# Patient Record
Sex: Female | Born: 1985
Health system: Southern US, Community
[De-identification: ages and names within clinical notes are randomized; demographics above are authoritative.]

## PROBLEM LIST (undated history)

## (undated) ENCOUNTER — Inpatient Hospital Stay (HOSPITAL_COMMUNITY): Payer: Self-pay

## (undated) DIAGNOSIS — Z789 Other specified health status: Secondary | ICD-10-CM

## (undated) HISTORY — PX: NO PAST SURGERIES: SHX2092

---
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
## 2010-09-05 ENCOUNTER — Emergency Department (HOSPITAL_COMMUNITY)
Admission: EM | Admit: 2010-09-05 | Discharge: 2010-09-05 | Disposition: A | Discharge status: Home / Self Care | Payer: No Typology Code available for payment source | Attending: Emergency Medicine | Admitting: Emergency Medicine

## 2010-09-05 ENCOUNTER — Emergency Department (HOSPITAL_COMMUNITY): Payer: Self-pay

## 2010-09-05 DIAGNOSIS — IMO0002 Reserved for concepts with insufficient information to code with codable children: Secondary | ICD-10-CM | POA: Insufficient documentation

## 2010-09-05 DIAGNOSIS — Y92410 Unspecified street and highway as the place of occurrence of the external cause: Secondary | ICD-10-CM | POA: Insufficient documentation

## 2010-09-05 DIAGNOSIS — S8010XA Contusion of unspecified lower leg, initial encounter: Secondary | ICD-10-CM | POA: Insufficient documentation

## 2010-09-05 DIAGNOSIS — M79609 Pain in unspecified limb: Secondary | ICD-10-CM | POA: Insufficient documentation

## 2015-05-07 ENCOUNTER — Emergency Department (HOSPITAL_COMMUNITY)
Admission: EM | Admit: 2015-05-07 | Discharge: 2015-05-07 | Disposition: A | Discharge status: Home / Self Care | Payer: 59 | Attending: Emergency Medicine | Admitting: Emergency Medicine

## 2015-05-07 ENCOUNTER — Encounter (HOSPITAL_COMMUNITY): Payer: Self-pay

## 2015-05-07 DIAGNOSIS — F17200 Nicotine dependence, unspecified, uncomplicated: Secondary | ICD-10-CM | POA: Insufficient documentation

## 2015-05-07 DIAGNOSIS — H578 Other specified disorders of eye and adnexa: Secondary | ICD-10-CM | POA: Diagnosis present

## 2015-05-07 DIAGNOSIS — H109 Unspecified conjunctivitis: Secondary | ICD-10-CM

## 2015-05-07 MED ORDER — POLYMYXIN B-TRIMETHOPRIM 10000-0.1 UNIT/ML-% OP SOLN: 1.0000 [drp] | OPHTHALMIC | Status: DC

## 2015-05-07 NOTE — Discharge Instructions (Signed)
You were seen in the ED for pink eye.  This is likely viral but we will treat you for bacterial infection.    Bacterial Conjunctivitis Bacterial conjunctivitis (commonly called pink eye) is redness, soreness, or puffiness (inflammation) of the white part of your eye. It is caused by a germ called bacteria. These germs can easily spread from person to person (contagious). Your eye often will become red or pink. Your eye may also become irritated, watery, or have a thick discharge.  HOME CARE  1. Apply a cool, clean washcloth over closed eyelids. Do this for 10-20 minutes, 3-4 times a day while you have pain. 2. Gently wipe away any fluid coming from the eye with a warm, wet washcloth or cotton ball. 3. Wash your hands often with soap and water. Use paper towels to dry your hands. 4. Do not share towels or washcloths. 5. Change or wash your pillowcase every day. 6. Do not use eye makeup until the infection is gone. 7. Do not use machines or drive if your vision is blurry. 8. Stop using contact lenses. Do not use them again until your doctor says it is okay. 9. Do not touch the tip of the eye drop bottle or medicine tube with your fingers when you put medicine on the eye. GET HELP RIGHT AWAY IF:  1. Your eye is not better after 3 days of starting your medicine. 2. You have a yellowish fluid coming out of the eye. 3. You have more pain in the eye. 4. Your eye redness is spreading. 5. Your vision becomes blurry. 6. You have a fever or lasting symptoms for more than 2-3 days. 7. You have a fever and your symptoms suddenly get worse. 8. You have pain in the face. 9. Your face gets red or puffy (swollen). MAKE SURE YOU:   Understand these instructions.  Will watch this condition.  Will get help right away if you are not doing well or get worse.   This information is not intended to replace advice given to you by your health care provider. Make sure you discuss any questions you have with your  health care provider.   Document Released: 12/04/2007 Document Revised: 02/11/2012 Document Reviewed: 10/31/2011 Elsevier Interactive Patient Education 2016 [employer].  How to Use Eye Drops and Eye Ointments HOW TO APPLY EYE DROPS Follow these steps when applying eye drops: 10. Wash your hands. 11. Tilt your head back. 12. Put a finger under your eye and use it to gently pull your lower lid downward. Keep that finger in place. 13. Using your other hand, hold the dropper between your thumb and index finger. 14. Position the dropper just over the edge of the lower lid. Hold it as close to your eye as you can without touching the dropper to your eye. 15. Steady your hand. One way to do this is to lean your index finger against your brow. 16. Look up. 17. Slowly and gently squeeze one drop of medicine into your eye. 18. Close your eye. 19. Place a finger between your lower eyelid and your nose. Press gently for 2 minutes. This increases the amount of time that the medicine is exposed to the eye. It also reduces side effects that can develop if the drop gets into the bloodstream through the nose. HOW TO APPLY EYE OINTMENTS Follow these steps when applying eye ointments: 10. Wash your hands. 11. Put a finger under your eye and use it to gently pull your lower  lid downward. Keep that finger in place. 12. Using your other hand, place the tip of the tube between your thumb and index finger with the remaining fingers braced against your cheek or nose. 13. Hold the tube just over the edge of your lower lid without touching the tube to your lid or eyeball. 14. Look up. 15. Line the inner part of your lower lid with ointment. 16. Gently pull up on your upper lid and look down. This will force the ointment to spread over the surface of the eye. 17. Release the upper lid. 18. If you can, close your eyes for 1-2 minutes. Do not rub your eyes. If you applied the ointment correctly, your vision will  be blurry for a few minutes. This is normal. ADDITIONAL INFORMATION  Make sure to use the eye drops or ointment as told by your health care provider.  If you have been told to use both eye drops and an eye ointment, apply the eye drops first, then wait 3-4 minutes before you apply the ointment.  Try not to touch the tip of the dropper or tube to your eye. A dropper or tube that has touched the eye can become contaminated.   This information is not intended to replace advice given to you by your health care provider. Make sure you discuss any questions you have with your health care provider.   Document Released: 06/02/2000 Document Revised: 07/11/2014 Document Reviewed: 02/20/2014 Elsevier Interactive Patient Education [employer]

## 2015-05-07 NOTE — ED Notes (Signed)
PT reports felt like she had something in her r eye Friday.  Woke up Sat and r eye was matted together.  Eye red, denies pain.

## 2015-05-07 NOTE — ED Provider Notes (Signed)
CSN: [id number]     Arrival date & time 05/07/15  7829 History   First MD Initiated Contact with Patient 05/07/15 [phone]     Chief Complaint  Patient presents with  . Eye Problem   The history is provided by the patient.   Patient notes that she developed a pink eye on R side 2 days ago.  It started off feeling like she had something in her eye but she could not find anything that was causing the foreign body sensation.  She notes that yesterday morning, she woke up and her right eye lids were stuck together.  She denies fevers, chills, pain with EOM, facial pain, visual disturbance, sick contacts.  No allergies.  Does not wear contacts.  History reviewed. No pertinent past medical history. History reviewed. No pertinent past surgical history. No family history on file. Social History  Substance Use Topics  . Smoking status: Current Every Day Smoker  . Smokeless tobacco: None  . Alcohol Use: No   OB History    No data available     Review of Systems  Constitutional: Negative for fever.  HENT: Negative for congestion, rhinorrhea and sinus pressure.   Eyes: Positive for discharge, redness and itching. Negative for photophobia, pain and visual disturbance.  Respiratory: Negative for cough.   Neurological: Negative for headaches.    Allergies  Review of patient's allergies indicates no known allergies.  Home Medications   Prior to Admission medications   Medication Sig Start Date End Date Taking? Authorizing Provider  trimethoprim-polymyxin b (POLYTRIM) ophthalmic solution Place 1 drop into the right eye every 4 (four) hours. x5 days. 05/07/15   Ashly M Gottschalk, DO   BP 136/83 mmHg  Pulse 91  Temp(Src) 97.4 F (36.3 C) (Oral)  Resp 16  Ht  (1.651 m)  Wt 72.576 kg  BMI 26.63 kg/m2  SpO2 99%  LMP 04/11/2015 Physical Exam  Constitutional: She appears well-developed and well-nourished. No distress.  HENT:  Head: Normocephalic and atraumatic.  Mouth/Throat:  Oropharynx is clear and moist.  Eyes: EOM are normal. Pupils are equal, round, and reactive to light. Right eye exhibits no discharge. Left eye exhibits no discharge.   Conjunctival injection in R eye.  No evidence of foreign body.  No pain with EOM.  No purulence visualized.  Neck: Normal range of motion.  Cardiovascular: Normal rate.   Pulmonary/Chest: Effort normal.  Musculoskeletal: Normal range of motion.  Neurological: She is alert. She exhibits normal muscle tone. Coordination normal.  Skin: Skin is warm. She is not diaphoretic.  Psychiatric: She has a normal mood and affect. Her behavior is normal. Thought content normal.   ED Course  Procedures (including critical care time) Labs Review Labs Reviewed - No data to display  Imaging Review No results found. I have personally reviewed and evaluated these images and lab results as part of my medical decision-making.   EKG Interpretation None      MDM   Final diagnoses:  Conjunctivitis of right eye    Meloni D Riviere is a 30 y.o. female that presents to ED with a 2 day history of ride sided pink eye.  No evidence of foreign body, peri-orbital cellulitis.  Low suspicion for allergic conjunctivitis given unilaterallity.  Low suspicion for corneal abrasion given absence of pain.  Likely viral vs bacterial conjunctivitis.  Patient does not use contact lenses.  Will empirically treat with PolyTrim eye gtts.  Return precautions reviewed.  Follow up with PCP as needed.  Raliegh Ip, DO 05/07/15 1610  Blane Ohara, MD 05/07/15 [phone]

## 2015-05-26 ENCOUNTER — Encounter (HOSPITAL_COMMUNITY): Payer: Self-pay | Admitting: *Deleted

## 2015-05-26 ENCOUNTER — Emergency Department (HOSPITAL_COMMUNITY)
Admission: EM | Admit: 2015-05-26 | Discharge: 2015-05-26 | Disposition: A | Discharge status: Home / Self Care | Payer: 59 | Attending: Emergency Medicine | Admitting: Emergency Medicine

## 2015-05-26 DIAGNOSIS — F17200 Nicotine dependence, unspecified, uncomplicated: Secondary | ICD-10-CM | POA: Insufficient documentation

## 2015-05-26 DIAGNOSIS — K047 Periapical abscess without sinus: Secondary | ICD-10-CM | POA: Insufficient documentation

## 2015-05-26 DIAGNOSIS — K0889 Other specified disorders of teeth and supporting structures: Secondary | ICD-10-CM | POA: Diagnosis present

## 2015-05-26 MED ORDER — TRAMADOL HCL 50 MG PO TABS: 50.0000 mg | ORAL_TABLET | Freq: Four times a day (QID) | ORAL | Status: DC | PRN

## 2015-05-26 MED ORDER — PENICILLIN V POTASSIUM 500 MG PO TABS: 500.0000 mg | ORAL_TABLET | Freq: Four times a day (QID) | ORAL | Status: DC

## 2015-05-26 MED ORDER — NAPROXEN 250 MG PO TABS
500.0000 mg | ORAL_TABLET | Freq: Once | ORAL | Status: AC
  Administered 2015-05-26: 500 mg via ORAL
  Filled 2015-05-26: qty 2

## 2015-05-26 MED ORDER — NAPROXEN 500 MG PO TABS: 500.0000 mg | ORAL_TABLET | Freq: Two times a day (BID) | ORAL | Status: DC

## 2015-05-26 NOTE — ED Provider Notes (Signed)
CSN: [id number]     Arrival date & time 05/26/15  0434 History   First MD Initiated Contact with Patient 05/26/15 0617     Chief Complaint  Patient presents with  . Dental Pain     (Consider location/radiation/quality/duration/timing/severity/associated sxs/prior Treatment) Patient is a 30 y.o. female presenting with tooth pain. The history is provided by the patient.  Dental Pain Location:  Lower Lower teeth location:  31/RL 2nd molar and 30/RL 1st molar Quality:  Radiating, pulsating, throbbing and shooting Severity:  Moderate Onset quality:  Gradual Duration:  1 month Timing:  Intermittent Progression:  Worsening Chronicity:  Recurrent Context: filling fell out and poor dentition   Relieved by:  Nothing Worsened by:  Cold food/drink Ineffective treatments:  Acetaminophen Associated symptoms: facial pain   Associated symptoms: no difficulty swallowing, no drooling, no facial swelling, no fever, no headaches, no neck pain, no neck swelling and no trismus   Risk factors: lack of dental care and periodontal disease   Risk factors: no diabetes and no immunosuppression     History reviewed. No pertinent past medical history. History reviewed. No pertinent past surgical history. History reviewed. No pertinent family history. Social History  Substance Use Topics  . Smoking status: Current Every Day Smoker  . Smokeless tobacco: None  . Alcohol Use: No   OB History    No data available     Review of Systems  Constitutional: Negative for fever.  HENT: Negative for drooling and facial swelling.   Musculoskeletal: Negative for neck pain.  Allergic/Immunologic: Negative for immunocompromised state.  Neurological: Negative for headaches.      Allergies  Review of patient's allergies indicates no known allergies.  Home Medications   Prior to Admission medications   Medication Sig Start Date End Date Taking? Authorizing Provider  naproxen (NAPROSYN) 500 MG tablet Take 1  tablet (500 mg total) by mouth 2 (two) times daily. 05/26/15   Derwood Kaplan, MD  penicillin v potassium (VEETID) 500 MG tablet Take 1 tablet (500 mg total) by mouth 4 (four) times daily. 05/26/15   Derwood Kaplan, MD  traMADol (ULTRAM) 50 MG tablet Take 1 tablet (50 mg total) by mouth every 6 (six) hours as needed. 05/26/15   Derwood Kaplan, MD  trimethoprim-polymyxin b (POLYTRIM) ophthalmic solution Place 1 drop into the right eye every 4 (four) hours. x5 days. 05/07/15   Ashly M Gottschalk, DO   BP 122/85 mmHg  Pulse 67  Temp(Src) 97.7 F (36.5 C)  Resp 18  Ht  (1.651 m)  Wt 160 lb (72.576 kg)  BMI 26.63 kg/m2  SpO2 99%  LMP 04/11/2015 Physical Exam  Constitutional: She is oriented to person, place, and time. She appears well-developed.  HENT:  Head: Normocephalic and atraumatic.  Mouth/Throat: No oropharyngeal exudate.  Right lower quadrant-  All 3 molars have decay. 2nd molar has significant decay with ? Exposed pulp. No trismus, no drooling  Eyes: EOM are normal.  Neck: Normal range of motion. Neck supple.  Cardiovascular: Normal rate.   Pulmonary/Chest: Effort normal.  Abdominal: Bowel sounds are normal.  Lymphadenopathy:    She has no cervical adenopathy.  Neurological: She is alert and oriented to person, place, and time.  Skin: Skin is warm and dry.  Nursing note and vitals reviewed.   ED Course  Procedures (including critical care time) Labs Review Labs Reviewed - No data to display  Imaging Review No results found. I have personally reviewed and evaluated these images and lab results  as part of my medical decision-making.   EKG Interpretation None      MDM   Final diagnoses:  Periapical abscess    DDx includes: - Periapical tooth infection - Dental abscess - Gingivitis - Dental trauma - Pulpitis - Nerve root compression  Appears to have periapical tooth infection and possible nerve root compression. She already has plan to see a Education officer, [place]entist [place]. Pen V K and meds ordered for pain control. No hard signs of deep infection or infection becoming systemic.     Derwood KaplanAnkit Cherrell Maybee, MD 05/26/15 [phone]

## 2015-05-26 NOTE — Discharge Instructions (Signed)
We saw you in the ER for the toothache. °No evidence of deep infection, no evidence of pus that can be drained out. °YOU WILL NEED TO SEE A DENTIST to get appropriate care. °Please take the antibiotics and pain meds provided in the interval. ° ° °Dental Abscess °A dental abscess is a collection of pus in or around a tooth. °CAUSES °This condition is caused by a bacterial infection around the root of the tooth that involves the inner part of the tooth (pulp). It may result from: °· Severe tooth decay. °· Trauma to the tooth that allows bacteria to enter into the pulp, such as a broken or chipped tooth. °· Severe gum disease around a tooth. °SYMPTOMS °Symptoms of this condition include: °· Severe pain in and around the infected tooth. °· Swelling and redness around the infected tooth, in the mouth, or in the face. °· Tenderness. °· Pus drainage. °· Bad breath. °· Bitter taste in the mouth. °· Difficulty swallowing. °· Difficulty opening the mouth. °· Nausea. °· Vomiting. °· Chills. °· Swollen neck glands. °· Fever. °DIAGNOSIS °This condition is diagnosed with examination of the infected tooth. During the exam, your dentist may tap on the infected tooth. Your dentist will also ask about your medical and dental history and may order X-rays. °TREATMENT °This condition is treated by eliminating the infection. This may be done with: °· Antibiotic medicine. °· A root canal. This may be performed to save the tooth. °· Pulling (extracting) the tooth. This may also involve draining the abscess. This is done if the tooth cannot be saved. °HOME CARE INSTRUCTIONS °· Take medicines only as directed by your dentist. °· If you were prescribed antibiotic medicine, finish all of it even if you start to feel better. °· Rinse your mouth (gargle) often with salt water to relieve pain or swelling. °· Do not drive or operate heavy machinery while taking pain medicine. °· Do not apply heat to the outside of your mouth. °· Keep all follow-up  visits as directed by your dentist. This is important. °SEEK MEDICAL CARE IF: °· Your pain is worse and is not helped by medicine. °SEEK IMMEDIATE MEDICAL CARE IF: °· You have a fever or chills. °· Your symptoms suddenly get worse. °· You have a very bad headache. °· You have problems breathing or swallowing. °· You have trouble opening your mouth. °· You have swelling in your neck or around your eye. °  °This information is not intended to replace advice given to you by your health care provider. Make sure you discuss any questions you have with your health care provider. °  °Document Released: 02/24/2005 Document Revised: 07/11/2014 Document Reviewed: 02/21/2014 °Elsevier Interactive Patient Education ©2016 [employer] Inc[employer] ° °

## 2015-05-26 NOTE — ED Notes (Signed)
Pt c/o pain to a tooth on the lower right side of her mouth.

## 2017-02-05 ENCOUNTER — Encounter (HOSPITAL_COMMUNITY): Payer: Self-pay | Admitting: Emergency Medicine

## 2017-02-05 ENCOUNTER — Other Ambulatory Visit: Payer: Self-pay

## 2017-02-05 ENCOUNTER — Emergency Department (HOSPITAL_COMMUNITY): Payer: Self-pay

## 2017-02-05 ENCOUNTER — Emergency Department (HOSPITAL_COMMUNITY)
Admission: EM | Admit: 2017-02-05 | Discharge: 2017-02-05 | Disposition: A | Discharge status: Home / Self Care | Payer: Self-pay | Attending: Emergency Medicine | Admitting: Emergency Medicine

## 2017-02-05 DIAGNOSIS — R079 Chest pain, unspecified: Secondary | ICD-10-CM | POA: Insufficient documentation

## 2017-02-05 DIAGNOSIS — F17200 Nicotine dependence, unspecified, uncomplicated: Secondary | ICD-10-CM | POA: Insufficient documentation

## 2017-02-05 LAB — CBC
HEMATOCRIT: 40.6 % (ref 36.0–46.0)
Hemoglobin: 13.2 g/dL (ref 12.0–15.0)
MCH: 31.7 pg (ref 26.0–34.0)
MCHC: 32.5 g/dL (ref 30.0–36.0)
MCV: 97.4 fL (ref 78.0–100.0)
Platelets: 260 10*3/uL (ref 150–400)
RBC: 4.17 MIL/uL (ref 3.87–5.11)
RDW: 13.2 % (ref 11.5–15.5)
WBC: 5.6 10*3/uL (ref 4.0–10.5)

## 2017-02-05 LAB — BASIC METABOLIC PANEL
Anion gap: 8 (ref 5–15)
BUN: 11 mg/dL (ref 6–20)
CALCIUM: 9.2 mg/dL (ref 8.9–10.3)
CHLORIDE: 103 mmol/L (ref 101–111)
CO2: 23 mmol/L (ref 22–32)
CREATININE: 0.75 mg/dL (ref 0.44–1.00)
GFR calc Af Amer: >60 mL/min (ref >60)
GFR calc non Af Amer: >60 mL/min (ref >60)
Glucose, Bld: 103 mg/dL — ABNORMAL HIGH (ref 65–99)
Potassium: 3.9 mmol/L (ref 3.5–5.1)
Sodium: 134 mmol/L — ABNORMAL LOW (ref 135–145)

## 2017-02-05 LAB — I-STAT TROPONIN, ED: Troponin i, poc: 0.05 ng/mL (ref 0.00–0.08)

## 2017-02-05 MED ORDER — NAPROXEN 500 MG PO TABS: 500.0000 mg | ORAL_TABLET | Freq: Two times a day (BID) | ORAL | 0 refills | Status: DC

## 2017-02-05 NOTE — Discharge Instructions (Signed)
Take medications as needed for pain, follow-up with a primary care doctor if not improving by next week, return to the emergency room for fevers chills, shortness of breath, worsening symptoms

## 2017-02-05 NOTE — ED Provider Notes (Signed)
[facility] Provider Note   CSN: [id number] Arrival date & time: 02/05/17  0800     History   Chief Complaint Chief Complaint  Patient presents with  . Chest Pain    HPI Heidi Owens is a 31 y.o. female.  HPI Patient presents to the emergency room for evaluation of chest pain.  Patient noticed discomfort in the center of her chest yesterday.  She also feels an aching in the back of her neck and shoulders.  It almost feels like a muscle pull.  She tried taking some antacid medications yesterday but it did not help.  Her symptoms persisted this morning so she came into the emergency room for evaluation.  She denies any trouble with any orifice of breath.  No trouble with any leg swelling.  No fevers or coughing.  Patient does not have any history of heart disease or lung disease.  No PE or DVT.  No family history of heart or lung disease. No recent travel.  No estrogen use. History reviewed. No pertinent past medical history.  There are no active problems to display for this patient.   History reviewed. No pertinent surgical history.  OB History    No data available       Home Medications    Prior to Admission medications   Medication Sig Start Date End Date Taking? Authorizing Provider  acetaminophen (TYLENOL) 325 MG tablet Take 325 mg by mouth every 6 (six) hours as needed for headache.   Yes [provider]  ibuprofen (ADVIL,MOTRIN) 200 MG tablet Take 400 mg by mouth every 6 (six) hours as needed for headache.   Yes [provider]  naproxen (NAPROSYN) 500 MG tablet Take 1 tablet (500 mg total) by mouth 2 (two) times daily. 02/05/17   Linwood DibblesKnapp, Liadan Guizar, MD    Family History History reviewed. No pertinent family history.  Social History Social History   Tobacco Use  . Smoking status: Current Every Day Smoker    Packs/day: 0.50  . Smokeless tobacco: Never Used  Substance Use Topics  . Alcohol use: Yes    Comment: occ  . Drug  use: No     Allergies   Patient has no known allergies.   Review of Systems Review of Systems  All other systems reviewed and are negative.    Physical Exam Updated Vital Signs BP 116/76 (BP Location: Left Arm)   Pulse 68   Temp (!) 97.4 F (36.3 C) (Oral)   Resp 20   Ht 1.651 m (5\' 5" )   Wt 72.6 kg (160 lb)   LMP 12/30/2016   SpO2 100%   BMI 26.63 kg/m   Physical Exam  Constitutional: She appears well-developed and well-nourished. No distress.  HENT:  Head: Normocephalic and atraumatic.  Right Ear: External ear normal.  Left Ear: External ear normal.  Eyes: Conjunctivae are normal. Right eye exhibits no discharge. Left eye exhibits no discharge. No scleral icterus.  Neck: Neck supple. No tracheal deviation present.  Cardiovascular: Normal rate, regular rhythm and intact distal pulses.  Pulmonary/Chest: Effort normal and breath sounds normal. No stridor. No respiratory distress. She has no wheezes. She has no rales.  No chest wall tenderness  Abdominal: Soft. Bowel sounds are normal. She exhibits no distension. There is no tenderness. There is no rebound and no guarding.  Musculoskeletal: She exhibits no edema or tenderness.  Mild tenderness palpation in the bilateral trapezius muscles  Neurological: She is alert. She has normal strength.  No cranial nerve deficit (no facial droop, extraocular movements intact, no slurred speech) or sensory deficit. She exhibits normal muscle tone. She displays no seizure activity. Coordination normal.  Skin: Skin is warm and dry. No rash noted.  Psychiatric: She has a normal mood and affect.  Nursing note and vitals reviewed.    ED Treatments / Results  Labs (all labs ordered are listed, but only abnormal results are displayed) Labs Reviewed  BASIC METABOLIC PANEL - Abnormal; Notable for the following components:      Result Value   Sodium 134 (*)    Glucose, Bld 103 (*)    All other components within normal limits  CBC    I-STAT TROPONIN, ED    EKG  EKG Interpretation  Date/Time:  Thursday February 05 2017 08:12:38 EST Ventricular Rate:  72 PR Interval:    QRS Duration: 75 QT Interval:  424 QTC Calculation: 464 R Axis:   75 Text Interpretation:  Sinus rhythm No old tracing to compare Confirmed by Linwood DibblesKnapp, Abrielle Finck [phone]([id number]) on 02/05/2017 8:18:39 AM       Radiology Dg Chest 2 View  Result Date: 02/05/2017 CLINICAL DATA:  Chest heaviness EXAM: CHEST  2 VIEW COMPARISON:  None. FINDINGS: The heart size and mediastinal contours are within normal limits. Both lungs are clear. The visualized skeletal structures are unremarkable. IMPRESSION: No active cardiopulmonary disease. Electronically Signed   By: Charlett NoseKevin  Dover M.D.   On: 02/05/2017 08:58    Procedures Procedures (including critical care time)  Medications Ordered in ED Medications - No data to display   Initial Impression / Assessment and Plan / ED Course  I have reviewed the triage vital signs and the nursing notes.  Pertinent labs & imaging results that were available during my care of the patient were reviewed by me and considered in my medical decision making (see chart for details).   Low risk heart score.  Doubt ACS.  Low risk wells, perc negative.  Doubt PE.  Suspect musculoskeletal etiology.  Will DC home with prescription for Naprosyn.  Follow-up with primary care doctor.  Warning signs and precautions discussed    Final Clinical Impressions(s) / ED Diagnoses   Final diagnoses:  Chest pain with low risk for cardiac etiology    ED Discharge Orders        Ordered    naproxen (NAPROSYN) 500 MG tablet  2 times daily     02/05/17 0950       Linwood DibblesKnapp, Debbe Crumble, MD 02/05/17 [phone]

## 2017-02-05 NOTE — ED Notes (Signed)
Patient transported to X-ray 

## 2017-02-05 NOTE — ED Triage Notes (Signed)
Chest pain center upper chest since yesterday. Constant. Worse with pressure when laying down. Denies n/v/d or SOB. Took antiacid medications without relief.

## 2017-08-02 ENCOUNTER — Other Ambulatory Visit: Payer: Self-pay

## 2017-08-02 ENCOUNTER — Emergency Department (HOSPITAL_COMMUNITY)
Admission: EM | Admit: 2017-08-02 | Discharge: 2017-08-02 | Disposition: A | Discharge status: Home / Self Care | Payer: Self-pay | Attending: Emergency Medicine | Admitting: Emergency Medicine

## 2017-08-02 ENCOUNTER — Encounter (HOSPITAL_COMMUNITY): Payer: Self-pay | Admitting: Emergency Medicine

## 2017-08-02 DIAGNOSIS — Y33XXXA Other specified events, undetermined intent, initial encounter: Secondary | ICD-10-CM | POA: Insufficient documentation

## 2017-08-02 DIAGNOSIS — S0501XA Injury of conjunctiva and corneal abrasion without foreign body, right eye, initial encounter: Secondary | ICD-10-CM | POA: Insufficient documentation

## 2017-08-02 DIAGNOSIS — F17210 Nicotine dependence, cigarettes, uncomplicated: Secondary | ICD-10-CM | POA: Insufficient documentation

## 2017-08-02 DIAGNOSIS — Y9289 Other specified places as the place of occurrence of the external cause: Secondary | ICD-10-CM | POA: Insufficient documentation

## 2017-08-02 DIAGNOSIS — Y939 Activity, unspecified: Secondary | ICD-10-CM | POA: Insufficient documentation

## 2017-08-02 DIAGNOSIS — Y990 Civilian activity done for income or pay: Secondary | ICD-10-CM | POA: Insufficient documentation

## 2017-08-02 MED ORDER — FLUORESCEIN SODIUM 1 MG OP STRP: 1.0000 | ORAL_STRIP | Freq: Once | OPHTHALMIC | Status: DC

## 2017-08-02 MED ORDER — TOBRAMYCIN 0.3 % OP SOLN
1.0000 [drp] | Freq: Once | OPHTHALMIC | Status: AC
  Administered 2017-08-02: 1 [drp] via OPHTHALMIC
  Filled 2017-08-02: qty 5

## 2017-08-02 MED ORDER — KETOROLAC TROMETHAMINE 0.5 % OP SOLN
1.0000 [drp] | Freq: Once | OPHTHALMIC | Status: AC
  Administered 2017-08-02: 1 [drp] via OPHTHALMIC

## 2017-08-02 MED ORDER — TETRACAINE HCL 0.5 % OP SOLN
2.0000 [drp] | Freq: Once | OPHTHALMIC | Status: DC
  Filled 2017-08-02: qty 4

## 2017-08-02 NOTE — ED Triage Notes (Signed)
Patient c/o right eye irritation. Patient states while at work on Thursday she felt like something was her eye, since then right eye red, dry, and irritated. Denies any drainage or blurred vision.

## 2017-08-02 NOTE — ED Provider Notes (Signed)
[facility] EMERGENCY DEPARTMENT Provider Note   CSN: [id number] Arrival date & time: 08/02/17  1442     History   Chief Complaint Chief Complaint  Patient presents with  . Eye Pain    HPI Heidi Owens is a 32 y.o. female.  HPI   Heidi Owens is a 32 y.o. female who presents to the Emergency Department complaining of foreign body sensation to the right eye.  Noticed irritation to her eye, admits to frequent rubbing of her eye then noticed redness and dryness to her eye.  She has used Visine drops without relief.  She denies swelling, decreased vision, discharge, facial pain or swelling, headache and use of corrective lenses..   History reviewed. No pertinent past medical history.  There are no active problems to display for this patient.   No past surgical history on file.   OB History    Gravida  0   Para  0   Term  0   Preterm  0   AB  0   Living  0     SAB  0   TAB  0   Ectopic  0   Multiple  0   Live Births  0            Home Medications    Prior to Admission medications   Medication Sig Start Date End Date Taking? Authorizing Provider  acetaminophen (TYLENOL) 325 MG tablet Take 325 mg by mouth every 6 (six) hours as needed for headache.   Yes [provider]  ibuprofen (ADVIL,MOTRIN) 200 MG tablet Take 400 mg by mouth every 6 (six) hours as needed for headache.   Yes [provider]    Family History No family history on file.  Social History Social History   Tobacco Use  . Smoking status: Current Every Day Smoker    Packs/day: 0.50    Types: Cigarettes  . Smokeless tobacco: Never Used  Substance Use Topics  . Alcohol use: Yes    Comment: occ  . Drug use: No     Allergies   Patient has no known allergies.   Review of Systems Review of Systems  Constitutional: Negative for chills and fever.  HENT: Negative for congestion and ear pain.   Eyes: Positive for pain and redness. Negative for discharge,  itching and visual disturbance.  Respiratory: Negative for chest tightness.   Gastrointestinal: Negative for nausea.  Skin: Negative for rash.  Neurological: Negative for dizziness, speech difficulty and headaches.     Physical Exam Updated Vital Signs BP 132/79 (BP Location: Right Arm)   Pulse 72   Temp 97.8 F (36.6 C) (Oral)   Resp 18   Ht  (1.651 m)   Wt 70.3 kg (155 lb)   LMP 07/10/2017   SpO2 100%   BMI 25.79 kg/m   Physical Exam  Constitutional: She is oriented to person, place, and time. She appears well-developed and well-nourished. No distress.  HENT:  Head: Normocephalic and atraumatic.  Eyes: Pupils are equal, round, and reactive to light. EOM are normal. Lids are everted and swept, no foreign bodies found. Right eye exhibits no chemosis, no discharge and no exudate. No foreign body present in the right eye. Right conjunctiva is injected. Left conjunctiva is not injected.  Fundoscopic exam:      The right eye shows no exudate and no papilledema.  Slit lamp exam:      The right eye shows corneal abrasion and  fluorescein uptake. The right eye shows no corneal ulcer, no foreign body and no hyphema.       The left eye shows no fluorescein uptake.  Slit lamp exam reveals negative Seidel's sign, no hyphema or FB.  Small corneal abrasion at the 3 o'clock position  Neck: Normal range of motion. Neck supple. No thyromegaly present.  Cardiovascular: Normal rate, regular rhythm and intact distal pulses.  No murmur heard. Pulmonary/Chest: Effort normal and breath sounds normal. No respiratory distress.  Musculoskeletal: Normal range of motion.  Lymphadenopathy:    She has no cervical adenopathy.  Neurological: She is alert and oriented to person, place, and time. She exhibits normal muscle tone. Coordination normal.  Skin: Skin is warm and dry. No rash noted.  Psychiatric: She has a normal mood and affect.  Nursing note and vitals reviewed.    ED Treatments /  Results  Labs (all labs ordered are listed, but only abnormal results are displayed) Labs Reviewed - No data to display  EKG None  Radiology No results found.  Procedures Procedures (including critical care time)  Medications Ordered in ED Medications  fluorescein ophthalmic strip 1 strip (1 strip Right Eye Handoff 08/02/17 1620)  tetracaine (PONTOCAINE) 0.5 % ophthalmic solution 2 drop (2 drops Right Eye Handoff 08/02/17 1621)     Initial Impression / Assessment and Plan / ED Course  I have reviewed the triage vital signs and the nursing notes.  Pertinent labs & imaging results that were available during my care of the patient were reviewed by me and considered in my medical decision making (see chart for details).         Visual acuity  OS 20/20 OD 20/20 OU 20/20      pt well appearing.     small corneal abrasion present.  Referral to ophthalm if needed.  Dispensed ketorolac and tobramycin.  Return precautions discussed.   Final Clinical Impressions(s) / ED Diagnoses   Final diagnoses:  Abrasion of right cornea, initial encounter    ED Discharge Orders    None       Rosey Bath 08/04/17 1823    Bethann Berkshire, MD 08/06/17 [phone]

## 2017-08-02 NOTE — Discharge Instructions (Addendum)
Cool compresses on/off to your eye.  Apply one drop of the ketorolac to your right eye 4 times a day and one drop of the tobramycin every 4 hours.  Apply the drops for 5- 7 days.  Call the eye doctor listed to arrange a follow-up appt in 2-3 days if not improving

## 2017-08-02 NOTE — ED Notes (Signed)
Awaiting eval  

## 2017-08-02 NOTE — ED Notes (Signed)
Visual acuity  OS 20/20 OD 20/20  OU 20/20 

## 2017-08-02 NOTE — ED Notes (Signed)
Awaiting meds from Rock Nephew, RN [facility] will retrieve

## 2017-08-03 ENCOUNTER — Emergency Department (HOSPITAL_COMMUNITY)
Admission: EM | Admit: 2017-08-03 | Discharge: 2017-08-03 | Disposition: A | Discharge status: Home / Self Care | Payer: Self-pay | Attending: Emergency Medicine | Admitting: Emergency Medicine

## 2017-08-03 ENCOUNTER — Encounter (HOSPITAL_COMMUNITY): Payer: Self-pay | Admitting: Emergency Medicine

## 2017-08-03 DIAGNOSIS — F17210 Nicotine dependence, cigarettes, uncomplicated: Secondary | ICD-10-CM | POA: Insufficient documentation

## 2017-08-03 DIAGNOSIS — S0501XA Injury of conjunctiva and corneal abrasion without foreign body, right eye, initial encounter: Secondary | ICD-10-CM

## 2017-08-03 DIAGNOSIS — Y33XXXD Other specified events, undetermined intent, subsequent encounter: Secondary | ICD-10-CM | POA: Insufficient documentation

## 2017-08-03 DIAGNOSIS — S0501XD Injury of conjunctiva and corneal abrasion without foreign body, right eye, subsequent encounter: Secondary | ICD-10-CM | POA: Insufficient documentation

## 2017-08-03 MED ORDER — TETRACAINE HCL 0.5 % OP SOLN
1.0000 [drp] | Freq: Once | OPHTHALMIC | Status: AC
  Administered 2017-08-03: 1 [drp] via OPHTHALMIC
  Filled 2017-08-03: qty 4

## 2017-08-03 MED ORDER — FLUORESCEIN SODIUM 1 MG OP STRP
1.0000 | ORAL_STRIP | Freq: Once | OPHTHALMIC | Status: AC
  Administered 2017-08-03: 1 via OPHTHALMIC
  Filled 2017-08-03: qty 1

## 2017-08-03 NOTE — ED Provider Notes (Signed)
[facility] EMERGENCY DEPARTMENT Provider Note   CSN: [id number] Arrival date & time: 08/03/17  3244     History   Chief Complaint Chief Complaint  Patient presents with  . Eye Problem    HPI Heidi Owens is a 32 y.o. female with no significant past medical history presenting in need of a work note so she can return to work.  She was seen here yesterday and diagnosed with a corneal abrasion.  She tried to work today but with her eye being red, they were concerned she may have pink eye and would not let her work.  She does endorse waking this morning with crusting along her eyelid margins and endorses increased tear production.  She denies vision changes.   The history is provided by the patient.    History reviewed. No pertinent past medical history.  There are no active problems to display for this patient.   History reviewed. No pertinent surgical history.   OB History    Gravida  0   Para  0   Term  0   Preterm  0   AB  0   Living  0     SAB  0   TAB  0   Ectopic  0   Multiple  0   Live Births  0            Home Medications    Prior to Admission medications   Medication Sig Start Date End Date Taking? Authorizing Provider  acetaminophen (TYLENOL) 325 MG tablet Take 325 mg by mouth every 6 (six) hours as needed for headache.    [provider]  ibuprofen (ADVIL,MOTRIN) 200 MG tablet Take 400 mg by mouth every 6 (six) hours as needed for headache.    [provider]    Family History History reviewed. No pertinent family history.  Social History Social History   Tobacco Use  . Smoking status: Current Every Day Smoker    Packs/day: 0.50    Types: Cigarettes  . Smokeless tobacco: Never Used  Substance Use Topics  . Alcohol use: Yes    Comment: occ  . Drug use: No     Allergies   Patient has no known allergies.   Review of Systems Review of Systems  Constitutional: Negative for chills and fever.  Eyes:  Positive for pain, discharge and redness. Negative for visual disturbance.  Skin: Negative for rash and wound.  All other systems reviewed and are negative.    Physical Exam Updated Vital Signs BP 110/75 (BP Location: Left Arm)   Pulse 62   Temp 98 F (36.7 C) (Oral)   Resp 16   Ht  (1.651 m)   Wt 70.3 kg (155 lb)   LMP 07/10/2017   SpO2 100%   BMI 25.79 kg/m   Physical Exam  Constitutional: She is oriented to person, place, and time. She appears well-developed and well-nourished.  HENT:  Head: Normocephalic and atraumatic.  Right Ear: Tympanic membrane and ear canal normal.  Left Ear: Tympanic membrane and ear canal normal.  Nose: Mucosal edema and rhinorrhea present.  Mouth/Throat: Uvula is midline, oropharynx is clear and moist and mucous membranes are normal. No oropharyngeal exudate, posterior oropharyngeal edema, posterior oropharyngeal erythema or tonsillar abscesses.  Eyes: Pupils are equal, round, and reactive to light. EOM and lids are normal. Right eye exhibits no exudate. Right conjunctiva is injected.  Slit lamp exam:      The right eye shows  corneal abrasion and fluorescein uptake.  Cardiovascular: Normal rate and normal heart sounds.  Pulmonary/Chest: Effort normal. No respiratory distress. She has no wheezes. She has no rales.  Abdominal: Soft. There is no tenderness.  Musculoskeletal: Normal range of motion.  Neurological: She is alert and oriented to person, place, and time.  Skin: Skin is warm and dry. No rash noted.  Psychiatric: She has a normal mood and affect.     ED Treatments / Results  Labs (all labs ordered are listed, but only abnormal results are displayed) Labs Reviewed - No data to display  EKG None  Radiology No results found.  Procedures Procedures (including critical care time)  Medications Ordered in ED Medications  tetracaine (PONTOCAINE) 0.5 % ophthalmic solution 1 drop (1 drop Right Eye Given by Other 08/03/17 4098)    fluorescein ophthalmic strip 1 strip (1 strip Right Eye Given by Other 08/03/17 1191)     Initial Impression / Assessment and Plan / ED Course  I have reviewed the triage vital signs and the nursing notes.  Pertinent labs & imaging results that were available during my care of the patient were reviewed by me and considered in my medical decision making (see chart for details).     Pt essentially here for work note. No change in current plan including tobrex, ketorolac gtts  Final Clinical Impressions(s) / ED Diagnoses   Final diagnoses:  Abrasion of right cornea, initial encounter    ED Discharge Orders    None       Victoriano Lain 08/03/17 0825    Samuel Jester, DO 08/08/17 [phone]

## 2017-08-03 NOTE — Discharge Instructions (Addendum)
Continue using the tobrex and ketoralac drops you were given yesterday.  It is ok to return to work.

## 2017-08-03 NOTE — ED Triage Notes (Signed)
Pt reports being seen yesterday and dx with corneal abrasion.  This morning right eye was crusted shut with yellow drainage.  Is concerned it is pink eye and they will not allow her at work.

## 2017-08-06 ENCOUNTER — Encounter (HOSPITAL_COMMUNITY): Payer: Self-pay

## 2017-08-06 ENCOUNTER — Other Ambulatory Visit: Payer: Self-pay

## 2017-08-06 ENCOUNTER — Emergency Department (HOSPITAL_COMMUNITY)
Admission: EM | Admit: 2017-08-06 | Discharge: 2017-08-07 | Disposition: A | Discharge status: Home / Self Care | Payer: Self-pay | Attending: Emergency Medicine | Admitting: Emergency Medicine

## 2017-08-06 DIAGNOSIS — O26899 Other specified pregnancy related conditions, unspecified trimester: Secondary | ICD-10-CM

## 2017-08-06 DIAGNOSIS — Z3A00 Weeks of gestation of pregnancy not specified: Secondary | ICD-10-CM | POA: Insufficient documentation

## 2017-08-06 DIAGNOSIS — O9989 Other specified diseases and conditions complicating pregnancy, childbirth and the puerperium: Secondary | ICD-10-CM | POA: Insufficient documentation

## 2017-08-06 DIAGNOSIS — F17210 Nicotine dependence, cigarettes, uncomplicated: Secondary | ICD-10-CM | POA: Insufficient documentation

## 2017-08-06 DIAGNOSIS — O26891 Other specified pregnancy related conditions, first trimester: Secondary | ICD-10-CM

## 2017-08-06 DIAGNOSIS — O99331 Smoking (tobacco) complicating pregnancy, first trimester: Secondary | ICD-10-CM | POA: Insufficient documentation

## 2017-08-06 DIAGNOSIS — O209 Hemorrhage in early pregnancy, unspecified: Secondary | ICD-10-CM

## 2017-08-06 DIAGNOSIS — R102 Pelvic and perineal pain: Secondary | ICD-10-CM | POA: Insufficient documentation

## 2017-08-06 DIAGNOSIS — R109 Unspecified abdominal pain: Secondary | ICD-10-CM

## 2017-08-06 MED ORDER — HYDROCODONE-ACETAMINOPHEN 5-325 MG PO TABS
1.0000 | ORAL_TABLET | Freq: Once | ORAL | Status: AC
  Administered 2017-08-06: 1 via ORAL
  Filled 2017-08-06: qty 1

## 2017-08-06 NOTE — ED Triage Notes (Signed)
My lower stomach is hurting and my period has been on for 2 days and it is only spotting per pt.  My regular period was at the beginning of the month per pt.  Patient states that she has been nauseated and vomited once today.

## 2017-08-06 NOTE — ED Provider Notes (Addendum)
[facility] EMERGENCY DEPARTMENT Provider Note   CSN: [id number] Arrival date & time: 08/06/17  2250     History   Chief Complaint Chief Complaint  Patient presents with  . Abdominal Pain    HPI Heidi Owens is a 32 y.o. female.  HPI  This is a 32 year old female who presents with lower abdominal cramping.  Patient reports onset of symptoms 2 days ago when she began to have spotty vaginal bleeding.  She states that her last menstrual period was on May 16.  She states "I thought my period came on again 2 days ago."  However, she reports light spotting and not a normal flow.  She reports crampy diffuse lower abdominal pain.  No dysuria.  She has had unprotected sex.  She denies vaginal discharge or concerns for STDs.  She denies any diarrhea.  She does report episodes of nonbloody emesis.  She is unsure if she may be pregnant.  Currently she rates her pain at 10 out of 10.  It does not radiate.  She took Tylenol with no relief.  History reviewed. No pertinent past medical history.  There are no active problems to display for this patient.   History reviewed. No pertinent surgical history.   OB History    Gravida  0   Para  0   Term  0   Preterm  0   AB  0   Living  0     SAB  0   TAB  0   Ectopic  0   Multiple  0   Live Births  0            Home Medications    Prior to Admission medications   Medication Sig Start Date End Date Taking? Authorizing Provider  acetaminophen (TYLENOL) 325 MG tablet Take 325 mg by mouth every 6 (six) hours as needed for headache.    [provider]  ibuprofen (ADVIL,MOTRIN) 200 MG tablet Take 400 mg by mouth every 6 (six) hours as needed for headache.    [provider]    Family History No family history on file.  Social History Social History   Tobacco Use  . Smoking status: Current Every Day Smoker    Packs/day: 0.50    Types: Cigarettes  . Smokeless tobacco: Never Used  Substance Use Topics   . Alcohol use: Yes    Comment: occ  . Drug use: No     Allergies   Patient has no known allergies.   Review of Systems Review of Systems  Constitutional: Negative for fever.  Respiratory: Negative for shortness of breath.   Cardiovascular: Negative for chest pain.  Gastrointestinal: Positive for abdominal pain, nausea and vomiting. Negative for diarrhea.  Genitourinary: Positive for vaginal bleeding. Negative for dysuria and vaginal discharge.  Musculoskeletal: Negative for back pain.  Skin: Negative for rash.  All other systems reviewed and are negative.    Physical Exam Updated Vital Signs BP 125/72   Pulse 60   Temp 98.4 F (36.9 C) (Oral)   Resp 17   Ht  (1.651 m)   Wt 70.3 kg (155 lb)   LMP 08/04/2017   SpO2 99%   BMI 25.79 kg/m   Physical Exam  Constitutional: She is oriented to person, place, and time. She appears well-developed and well-nourished.  HENT:  Head: Normocephalic and atraumatic.  Neck: Neck supple.  Cardiovascular: Normal rate, regular rhythm and normal heart sounds.  Pulmonary/Chest: Effort normal. No  respiratory distress. She has no wheezes.  Abdominal: Soft. Bowel sounds are normal. There is tenderness in the suprapubic area. There is no rebound and no guarding.  Genitourinary: Vagina normal.  Genitourinary Comments: Dark blood pooled in the vaginal vault, no clots noted, no masses noted, patient does have some right-sided adnexal tenderness, cervical os closed  Neurological: She is alert and oriented to person, place, and time.  Skin: Skin is warm and dry.  Psychiatric: She has a normal mood and affect.  Nursing note and vitals reviewed.    ED Treatments / Results  Labs (all labs ordered are listed, but only abnormal results are displayed) Labs Reviewed  WET PREP, GENITAL - Abnormal; Notable for the following components:      Result Value   Clue Cells Wet Prep HPF POC PRESENT (*)    WBC, Wet Prep HPF POC MANY (*)    All  other components within normal limits  PREGNANCY, URINE - Abnormal; Notable for the following components:   Preg Test, Ur POSITIVE (*)    All other components within normal limits  URINALYSIS, ROUTINE W REFLEX MICROSCOPIC - Abnormal; Notable for the following components:   Hgb urine dipstick MODERATE (*)    Bacteria, UA RARE (*)    All other components within normal limits  HCG, QUANTITATIVE, PREGNANCY - Abnormal; Notable for the following components:   hCG, Beta Chain, Quant, S 1,156 (*)    All other components within normal limits  CBC  ABO/RH  GC/CHLAMYDIA PROBE AMP ([place]) NOT AT [facility]    EKG None  Radiology US Ob Comp Less 14 Wks  Result Date: 08/07/2017 CLINICAL DATA:  Acute onset of generalized abdominal cramping and vaginal spotting. EXAM: OBSTETRIC <14 WK [place] AND TRANSVAGINAL OB US TECHNIQUE: Both transabdominal and transvaginal ultrasound examinations were performed for complete evaluation of the gestation as well as the maternal uterus, adnexal regions, and pelvic cul-de-sac. Transvaginal technique was performed to assess early pregnancy. COMPARISON:  None. FINDINGS: Intrauterine gestational sac: None seen. Yolk sac:  N/A Embryo:  N/A Subchorionic hemorrhage:  None visualized. Maternal uterus/adnexae: The uterus is unremarkable in appearance. The ovaries are within normal limits. No suspicious adnexal masses are seen; there is no evidence for ovarian torsion. No free fluid is seen within the pelvic cul-de-sac. IMPRESSION: No intrauterine gestational sac seen at this time. No evidence for ectopic pregnancy. This may remain within normal limits, given the quantitative beta hCG level of 1156. If the patient's quantitative beta HCG continues to trend upward, follow-up pelvic ultrasound could performed in 2 weeks for further evaluation. Electronically Signed   By: Roanna Raider M.D.   On: 08/07/2017 04:45   US Ob Transvaginal  Result Date: 08/07/2017 CLINICAL DATA:  Acute onset  of generalized abdominal cramping and vaginal spotting. EXAM: OBSTETRIC <14 WK [place] AND TRANSVAGINAL OB US TECHNIQUE: Both transabdominal and transvaginal ultrasound examinations were performed for complete evaluation of the gestation as well as the maternal uterus, adnexal regions, and pelvic cul-de-sac. Transvaginal technique was performed to assess early pregnancy. COMPARISON:  None. FINDINGS: Intrauterine gestational sac: None seen. Yolk sac:  N/A Embryo:  N/A Subchorionic hemorrhage:  None visualized. Maternal uterus/adnexae: The uterus is unremarkable in appearance. The ovaries are within normal limits. No suspicious adnexal masses are seen; there is no evidence for ovarian torsion. No free fluid is seen within the pelvic cul-de-sac. IMPRESSION: No intrauterine gestational sac seen at this time. No evidence for ectopic pregnancy. This may remain within normal limits, given  the quantitative beta hCG level of 1156. If the patient's quantitative beta HCG continues to trend upward, follow-up pelvic ultrasound could performed in 2 weeks for further evaluation. Electronically Signed   By: Roanna Raider M.D.   On: 08/07/2017 04:45    Procedures Procedures (including critical care time)  Medications Ordered in ED Medications  HYDROcodone-acetaminophen (NORCO/VICODIN) 5-325 MG per tablet 1 tablet (1 tablet Oral Given 08/06/17 2349)     Initial Impression / Assessment and Plan / ED Course  I have reviewed the triage vital signs and the nursing notes.  Pertinent labs & imaging results that were available during my care of the patient were reviewed by me and considered in my medical decision making (see chart for details).  Clinical Course as of Aug 07 453  Fri Aug 07, 2017  0046 Pregnancy, urine(!) [CH]    Clinical Course User Index [CH] Wilkie Aye, Mayer Masker, MD    Patient presents with abdominal pain and vaginal bleeding.  Reports last regular menstrual period was approximately 2 weeks ago.   Patient was given Norco.  Urinalysis and urine pregnancy obtained.  Urine pregnancy is positive.  I have ordered quant, blood type, and CBC to evaluate for anemia.  She does have some right-sided adnexal tenderness without mass on exam.  No clots.  Cervical os is closed.  Beta hCG 1156.  This seems an appropriate given reported last menstrual period.  Considerations include normal pregnancy with first trimester bleeding, threatened miscarriage, ectopic pregnancy.  Difficult to fully assess without trending beta.  I discussed at length the results with the patient and her mother.  Patient reports that her pain has essentially gone away since receiving Norco.  Given unclear dating and pain on exam, will call in ultrasound for rule out ectopic.  4:55 AM Ultrasound does not show definitive ectopic.  Nor does it show an intrauterine sac.  Patient will need a repeat beta-hCG at 48 hours to see if appropriately doubling.  She will need an ultrasound.  This was relayed to the patient and her mother.  Follow-up with Dr. Emelda Fear.  After history, exam, and medical workup I feel the patient has been appropriately medically screened and is safe for discharge home. Pertinent diagnoses were discussed with the patient. Patient was given return precautions.    Final Clinical Impressions(s) / ED Diagnoses   Final diagnoses:  Bleeding in early pregnancy  Abdominal pain during pregnancy in first trimester    ED Discharge Orders    None       Galen Malkowski, Mayer Masker, MD 08/07/17 5409    Shon Baton, MD 08/07/17 [phone]

## 2017-08-07 ENCOUNTER — Emergency Department (HOSPITAL_COMMUNITY): Payer: Self-pay

## 2017-08-07 ENCOUNTER — Telehealth: Payer: Self-pay | Admitting: Obstetrics & Gynecology

## 2017-08-07 ENCOUNTER — Telehealth: Payer: Self-pay | Admitting: Obstetrics and Gynecology

## 2017-08-07 LAB — WET PREP, GENITAL
SPERM: NONE SEEN
TRICH WET PREP: NONE SEEN
Yeast Wet Prep HPF POC: NONE SEEN

## 2017-08-07 LAB — URINALYSIS, ROUTINE W REFLEX MICROSCOPIC
BILIRUBIN URINE: NEGATIVE
Glucose, UA: NEGATIVE mg/dL
KETONES UR: NEGATIVE mg/dL
LEUKOCYTES UA: NEGATIVE
Nitrite: NEGATIVE
PH: 5 (ref 5.0–8.0)
Protein, ur: NEGATIVE mg/dL
SPECIFIC GRAVITY, URINE: 1.017 (ref 1.005–1.030)

## 2017-08-07 LAB — CBC
HCT: 42 % (ref 36.0–46.0)
Hemoglobin: 14.2 g/dL (ref 12.0–15.0)
MCH: 32.1 pg (ref 26.0–34.0)
MCHC: 33.8 g/dL (ref 30.0–36.0)
MCV: 95 fL (ref 78.0–100.0)
PLATELETS: 291 10*3/uL (ref 150–400)
RBC: 4.42 MIL/uL (ref 3.87–5.11)
RDW: 13 % (ref 11.5–15.5)
WBC: 8.7 10*3/uL (ref 4.0–10.5)

## 2017-08-07 LAB — PREGNANCY, URINE: PREG TEST UR: POSITIVE — AB

## 2017-08-07 LAB — ABO/RH: ABO/RH(D): B POS

## 2017-08-07 LAB — HCG, QUANTITATIVE, PREGNANCY: HCG, BETA CHAIN, QUANT, S: 1156 m[IU]/mL — AB (ref <5)

## 2017-08-07 NOTE — Telephone Encounter (Signed)
PT  [facility] for Pregnancy of unknown location with some one-sided pelvic pain. [facility] for followup HCG and consideration of repeat u/s Subsequent f/u [facility].

## 2017-08-07 NOTE — Telephone Encounter (Signed)
Pt called and stated that she found out last night that she is pregnant and wanted to make an appointment. [facility]. Discussed w/ Tish and advised patient that we would follow up w/ her on Monday. It looks like pt needs to repeat hcg and if the level is rising, an ultrasound in 2 weeks. We haven't seen patient before, so need to check w/ Cyril MourningJennifer Griffin on Monday regarding an order. 08/07/17 JM

## 2017-08-07 NOTE — ED Notes (Signed)
Pt back from us

## 2017-08-07 NOTE — Discharge Instructions (Addendum)
You were seen today for abdominal pain and vaginal bleeding.  Your ultrasound at this time does not show an ectopic pregnancy.  You need a repeat blood pregnancy test in 48 hours.  If appropriately increasing, you will need a repeat ultrasound in approximately 2 weeks.  Monitor your bleeding.  If you have increased bleeding, passage of clots, bleeding greater than 1 pad per hour you need to be reevaluated.  If you develop worsening pain, you should also be reevaluated.  Call the clinic above to schedule repeat beta-hCG.

## 2017-08-07 NOTE — ED Notes (Signed)
Patient transported to Ultrasound 

## 2017-08-08 ENCOUNTER — Encounter: Payer: Self-pay | Admitting: Student

## 2017-08-08 ENCOUNTER — Inpatient Hospital Stay (HOSPITAL_COMMUNITY)
Admission: AD | Admit: 2017-08-08 | Discharge: 2017-08-08 | Disposition: A | Discharge status: Home / Self Care | Payer: Self-pay | Source: Ambulatory Visit | Attending: Obstetrics and Gynecology | Admitting: Obstetrics and Gynecology

## 2017-08-08 ENCOUNTER — Inpatient Hospital Stay (HOSPITAL_COMMUNITY): Payer: Self-pay

## 2017-08-08 DIAGNOSIS — O00101 Right tubal pregnancy without intrauterine pregnancy: Secondary | ICD-10-CM

## 2017-08-08 DIAGNOSIS — Z3A00 Weeks of gestation of pregnancy not specified: Secondary | ICD-10-CM | POA: Insufficient documentation

## 2017-08-08 DIAGNOSIS — O26891 Other specified pregnancy related conditions, first trimester: Secondary | ICD-10-CM

## 2017-08-08 DIAGNOSIS — O209 Hemorrhage in early pregnancy, unspecified: Secondary | ICD-10-CM | POA: Insufficient documentation

## 2017-08-08 DIAGNOSIS — O3680X0 Pregnancy with inconclusive fetal viability, not applicable or unspecified: Secondary | ICD-10-CM

## 2017-08-08 DIAGNOSIS — R102 Pelvic and perineal pain: Secondary | ICD-10-CM | POA: Insufficient documentation

## 2017-08-08 DIAGNOSIS — R109 Unspecified abdominal pain: Secondary | ICD-10-CM

## 2017-08-08 DIAGNOSIS — Z87891 Personal history of nicotine dependence: Secondary | ICD-10-CM | POA: Insufficient documentation

## 2017-08-08 HISTORY — DX: Other specified health status: Z78.9

## 2017-08-08 LAB — COMPREHENSIVE METABOLIC PANEL
ALBUMIN: 4.5 g/dL (ref 3.5–5.0)
ALK PHOS: 65 U/L (ref 38–126)
ALT: 15 U/L (ref 14–54)
AST: 20 U/L (ref 15–41)
Anion gap: 10 (ref 5–15)
BUN: 12 mg/dL (ref 6–20)
CALCIUM: 9.8 mg/dL (ref 8.9–10.3)
CO2: 25 mmol/L (ref 22–32)
CREATININE: 0.75 mg/dL (ref 0.44–1.00)
Chloride: 103 mmol/L (ref 101–111)
GFR calc Af Amer: >60 mL/min (ref >60)
GFR calc non Af Amer: >60 mL/min (ref >60)
GLUCOSE: 95 mg/dL (ref 65–99)
Potassium: 4.3 mmol/L (ref 3.5–5.1)
Sodium: 138 mmol/L (ref 135–145)
Total Bilirubin: 0.6 mg/dL (ref 0.3–1.2)
Total Protein: 8.3 g/dL — ABNORMAL HIGH (ref 6.5–8.1)

## 2017-08-08 LAB — CBC WITH DIFFERENTIAL/PLATELET
BASOS PCT: 0 %
Basophils Absolute: 0 10*3/uL (ref 0.0–0.1)
EOS PCT: 3 %
Eosinophils Absolute: 0.3 10*3/uL (ref 0.0–0.7)
HCT: 44.4 % (ref 36.0–46.0)
HEMOGLOBIN: 15 g/dL (ref 12.0–15.0)
LYMPHS ABS: 2.3 10*3/uL (ref 0.7–4.0)
Lymphocytes Relative: 24 %
MCH: 32.8 pg (ref 26.0–34.0)
MCHC: 33.8 g/dL (ref 30.0–36.0)
MCV: 96.9 fL (ref 78.0–100.0)
MONO ABS: 0.3 10*3/uL (ref 0.1–1.0)
Monocytes Relative: 3 %
NEUTROS PCT: 70 %
Neutro Abs: 6.8 10*3/uL (ref 1.7–7.7)
Other: 0 %
Platelets: 303 10*3/uL (ref 150–400)
RBC: 4.58 MIL/uL (ref 3.87–5.11)
RDW: 13.4 % (ref 11.5–15.5)
WBC: 9.7 10*3/uL (ref 4.0–10.5)

## 2017-08-08 LAB — URINALYSIS, ROUTINE W REFLEX MICROSCOPIC
BILIRUBIN URINE: NEGATIVE
Glucose, UA: NEGATIVE mg/dL
HGB URINE DIPSTICK: NEGATIVE
KETONES UR: NEGATIVE mg/dL
Leukocytes, UA: NEGATIVE
NITRITE: NEGATIVE
PH: 7 (ref 5.0–8.0)
Protein, ur: NEGATIVE mg/dL
SPECIFIC GRAVITY, URINE: 1.023 (ref 1.005–1.030)

## 2017-08-08 LAB — HCG, QUANTITATIVE, PREGNANCY: hCG, Beta Chain, Quant, S: 394 m[IU]/mL — ABNORMAL HIGH (ref <5)

## 2017-08-08 MED ORDER — METHOTREXATE INJECTION FOR WOMEN'S HOSPITAL
50.0000 mg/m2 | Freq: Once | INTRAMUSCULAR | Status: AC
  Administered 2017-08-08: 90 mg via INTRAMUSCULAR
  Filled 2017-08-08: qty 1.8

## 2017-08-08 MED ORDER — NALBUPHINE HCL 10 MG/ML IJ SOLN
10.0000 mg | Freq: Once | INTRAMUSCULAR | Status: AC
  Administered 2017-08-08: 10 mg via INTRAMUSCULAR
  Filled 2017-08-08: qty 1

## 2017-08-08 MED ORDER — OXYCODONE-ACETAMINOPHEN 5-325 MG PO TABS: 2.0000 | ORAL_TABLET | ORAL | 0 refills | Status: DC | PRN

## 2017-08-08 NOTE — MAU Note (Signed)
Pt was seen yesterday and is having abdominal pain and spotting. Pain is 10/10.

## 2017-08-08 NOTE — MAU Provider Note (Signed)
History   G1 early preg in with c/o abd pain since Tuesday and has progressively gotten worse.Very painful today.  [facility] and had quant of 1156 and us that is yet to show IUP, no free fluid noted in pelvis. States pain is worse today. Last had solids and fluids about 1 1/2 hrs ago. Also started brownish vag bleeding this morning.  CSN: [id number]  Arrival date & time 08/08/17  1548   None     Chief Complaint  Patient presents with  . Abdominal Pain  . Vaginal Bleeding    HPI  Past Medical History:  Diagnosis Date  . Medical history non-contributory     Past Surgical History:  Procedure Laterality Date  . NO PAST SURGERIES      No family history on file.  Social History   Tobacco Use  . Smoking status: Former Smoker    Packs/day: 0.50    Types: Cigarettes    Last attempt to quit: 07/08/2017    Years since quitting: 0.0  . Smokeless tobacco: Never Used  Substance Use Topics  . Alcohol use: Yes    Comment: occ  . Drug use: No    OB History    Gravida  1   Para  0   Term  0   Preterm  0   AB  0   Living  0     SAB  0   TAB  0   Ectopic  0   Multiple  0   Live Births  0           Review of Systems  Constitutional: Negative.   HENT: Negative.   Eyes: Negative.   Respiratory: Negative.   Cardiovascular: Negative.   Gastrointestinal: Positive for abdominal pain.  Endocrine: Negative.   Genitourinary: Positive for vaginal bleeding.  Musculoskeletal: Negative.   Skin: Negative.   Allergic/Immunologic: Negative.   Neurological: Negative.   Hematological: Negative.   Psychiatric/Behavioral: Negative.     Allergies  Patient has no known allergies.  Home Medications    BP 125/79 (BP Location: Right Arm)   Pulse 66   Temp 98.3 F (36.8 C) (Oral)   Resp 16   Wt 148 lb (67.1 kg)   LMP 07/24/2017   SpO2 99%   BMI 24.63 kg/m   Physical Exam  Constitutional: She is oriented to person, place, and time. She appears  well-developed and well-nourished.  HENT:  Head: Normocephalic.  Cardiovascular: Normal rate, regular rhythm, normal heart sounds and intact distal pulses.  Pulmonary/Chest: Effort normal and breath sounds normal.  Abdominal: Normal appearance and bowel sounds are normal. There is tenderness in the right lower quadrant.  Genitourinary: Vagina normal.  Neurological: She is alert and oriented to person, place, and time.  Skin: Skin is warm and dry.  Psychiatric: She has a normal mood and affect. Her behavior is normal.    MAU Course  Procedures (including critical care time)  Labs Reviewed  URINALYSIS, ROUTINE W REFLEX MICROSCOPIC  CBC WITH DIFFERENTIAL/PLATELET   [place] Ob Comp Less 14 Wks  Result Date: 08/07/2017 CLINICAL DATA:  Acute onset of generalized abdominal cramping and vaginal spotting. EXAM: OBSTETRIC <14 WK US AND TRANSVAGINAL OB US TECHNIQUE: Both transabdominal and transvaginal ultrasound examinations were performed for complete evaluation of the gestation as well as the maternal uterus, adnexal regions, and pelvic cul-de-sac. Transvaginal technique was performed to assess early pregnancy. COMPARISON:  None. FINDINGS: Intrauterine gestational sac: None seen. Yolk sac:  N/A Embryo:  N/A Subchorionic hemorrhage:  None visualized. Maternal uterus/adnexae: The uterus is unremarkable in appearance. The ovaries are within normal limits. No suspicious adnexal masses are seen; there is no evidence for ovarian torsion. No free fluid is seen within the pelvic cul-de-sac. IMPRESSION: No intrauterine gestational sac seen at this time. No evidence for ectopic pregnancy. This may remain within normal limits, given the quantitative beta hCG level of 1156. If the patient's quantitative beta HCG continues to trend upward, follow-up pelvic ultrasound could performed in 2 weeks for further evaluation. Electronically Signed   By: Roanna Raider M.D.   On: 08/07/2017 04:45   US Ob Transvaginal  Result  Date: 08/07/2017 CLINICAL DATA:  Acute onset of generalized abdominal cramping and vaginal spotting. EXAM: OBSTETRIC <14 WK [place] AND TRANSVAGINAL OB US TECHNIQUE: Both transabdominal and transvaginal ultrasound examinations were performed for complete evaluation of the gestation as well as the maternal uterus, adnexal regions, and pelvic cul-de-sac. Transvaginal technique was performed to assess early pregnancy. COMPARISON:  None. FINDINGS: Intrauterine gestational sac: None seen. Yolk sac:  N/A Embryo:  N/A Subchorionic hemorrhage:  None visualized. Maternal uterus/adnexae: The uterus is unremarkable in appearance. The ovaries are within normal limits. No suspicious adnexal masses are seen; there is no evidence for ovarian torsion. No free fluid is seen within the pelvic cul-de-sac. IMPRESSION: No intrauterine gestational sac seen at this time. No evidence for ectopic pregnancy. This may remain within normal limits, given the quantitative beta hCG level of 1156. If the patient's quantitative beta HCG continues to trend upward, follow-up pelvic ultrasound could performed in 2 weeks for further evaluation. Electronically Signed   By: Roanna Raider M.D.   On: 08/07/2017 04:45     1. Abdominal pain in pregnancy, first trimester   2. Pregnancy of unknown anatomic location   3. Vaginal bleeding in pregnancy, first trimester       MDM  VSS, pt very tender RLQ with exam. Very restless and fidgety.  Scant amt brownish vag bleeding. 18:00  Pt states pain is somewhat better. Rectopic pregnancy. POC discussed with Dr. Jolayne Panther. Risk and benefits of Methotrexate therapy discussed with pt and she is in agreement with POC. Will give Methotrexate and give printed precautions and d/c home to f/u at [facility] on Tuesday.

## 2017-08-08 NOTE — Discharge Instructions (Signed)
Methotrexate Treatment for an Ectopic Pregnancy, Care After °Refer to this sheet in the next few weeks. These instructions provide you with information on caring for yourself after your procedure. Your health care provider may also give you more specific instructions. Your treatment has been planned according to current medical practices, but problems sometimes occur. Call your health care provider if you have any problems or questions after your procedure. °What can I expect after the procedure? °You may have some abdominal cramping, vaginal bleeding, and fatigue in the first few days after taking methotrexate. Some other possible side effects of methotrexate include: °· Nausea. °· Vomiting. °· Diarrhea. °· Mouth sores. °· Swelling or irritation of the lining of your lungs (pneumonitis). °· Liver damage. °· Hair loss. ° °Follow these instructions at home: °After you have received the methotrexate medicine, you need to be careful of your activities and watch your condition for several weeks. It may take 1 week before your hormone levels return to normal. °Activity °· Do not have sexual intercourse until your health care provider says it is safe to do so. °· You may resume your usual diet. °· Limit strenuous activity. °· Do not drink alcohol. °General instructions °· Do not take aspirin, ibuprofen, or naproxen (nonsteroidal anti-inflammatory drugs [NSAIDs]). °· Do not take folic acid, prenatal vitamins, or other vitamins that contain folic acid. °· Avoid traveling too far away from your health care provider. °· Keep all follow-up visits as told by your health care provider. This is important. °Contact a health care provider if: °· You cannot control your nausea and vomiting. °· You cannot control your diarrhea. °· You have sores in your mouth and want treatment. °· You need pain medicine for your abdominal pain. °· You have a rash. °· You are having a reaction to the medicine. °Get help right away if: °· You have  increasing abdominal or pelvic pain. °· You notice increased bleeding. °· You feel light-headed, or you faint. °· You have shortness of breath. °· Your heart rate increases. °· You have a cough. °· You have chills. °· You have a fever. °This information is not intended to replace advice given to you by your health care provider. Make sure you discuss any questions you have with your health care provider. °Document Released: 02/13/2011 Document Revised: 08/02/2015 Document Reviewed: 12/13/2012 °Elsevier Interactive Patient Education © 2017 [employer] °Ectopic Pregnancy °An ectopic pregnancy happens when a fertilized egg grows outside the uterus. A pregnancy cannot live outside of the uterus. This problem often happens in the fallopian tube. It is often caused by damage to the fallopian tube. °If this problem is found early, you may be treated with medicine. If your tube tears or bursts open (ruptures), you will bleed inside. This is an emergency. You will need surgery. Get help right away. °What are the signs or symptoms? °You may have normal pregnancy symptoms at first. These include: °· Missing your period. °· Feeling sick to your stomach (nauseous). °· Being tired. °· Having tender breasts. ° °Then, you may start to have symptoms that are not normal. These include: °· Pain with sex (intercourse). °· Bleeding from the vagina. This includes light bleeding (spotting). °· Belly (abdomen) or lower belly cramping or pain. This may be felt on one side. °· A fast heartbeat (pulse). °· Passing out (fainting) after going poop (bowel movement). ° °If your tube tears, you may have symptoms such as: °· Really bad pain in the belly or lower belly. This   happens suddenly. °· Dizziness. °· Passing out. °· Shoulder pain. ° °Get help right away if: °You have any of these symptoms. This is an emergency. °This information is not intended to replace advice given to you by your health care provider. Make sure you discuss any  questions you have with your health care provider. °Document Released: 05/23/2008 Document Revised: 08/02/2015 Document Reviewed: 10/06/2012 °Elsevier Interactive Patient Education © 2017 [employer]vier Inc. ° °

## 2017-08-10 ENCOUNTER — Telehealth: Payer: Self-pay | Admitting: Women's Health

## 2017-08-10 LAB — GC/CHLAMYDIA PROBE AMP (~~LOC~~) NOT AT ARMC
Chlamydia: NEGATIVE
Neisseria Gonorrhea: NEGATIVE

## 2017-08-10 NOTE — Telephone Encounter (Signed)
[facility] and was told to follow up with us on Tuesday, she has never been here before/ pt missed AB

## 2017-08-10 NOTE — Telephone Encounter (Signed)
LMOVM that she will just need HCG. Placed on lab schedule for tomorrow. Advised to call back if she has further questions.

## 2017-08-10 NOTE — Telephone Encounter (Signed)
HCG only

## 2017-08-11 ENCOUNTER — Other Ambulatory Visit: Payer: Self-pay

## 2017-08-11 DIAGNOSIS — O00101 Right tubal pregnancy without intrauterine pregnancy: Secondary | ICD-10-CM

## 2017-08-12 LAB — BETA HCG QUANT (REF LAB): HCG QUANT: 96 m[IU]/mL

## 2017-08-13 ENCOUNTER — Telehealth: Payer: Self-pay | Admitting: *Deleted

## 2017-08-13 NOTE — Telephone Encounter (Signed)
LMOVM that quant is down to 96. Need to repeat in a week. Advised to call office and get on lab schedule.

## 2017-08-13 NOTE — Telephone Encounter (Signed)
Spoke to patient.  All questions answered. Placed on lab schedule for next Wednesday.

## 2017-08-14 ENCOUNTER — Other Ambulatory Visit: Payer: Self-pay

## 2017-08-19 ENCOUNTER — Other Ambulatory Visit: Payer: Self-pay

## 2017-08-19 DIAGNOSIS — O00101 Right tubal pregnancy without intrauterine pregnancy: Secondary | ICD-10-CM

## 2017-08-20 ENCOUNTER — Encounter: Payer: Self-pay | Admitting: *Deleted

## 2017-08-20 ENCOUNTER — Telehealth: Payer: Self-pay | Admitting: *Deleted

## 2017-08-20 LAB — BETA HCG QUANT (REF LAB): hCG Quant: 5 m[IU]/mL

## 2017-08-20 NOTE — Telephone Encounter (Signed)
LMOVM that HCG 5 and does not require anymore blood draws.  Advised to call if further questions.

## 2017-12-07 ENCOUNTER — Emergency Department (HOSPITAL_COMMUNITY): Admission: EM | Admit: 2017-12-07 | Payer: Self-pay | Source: Home / Self Care

## 2018-04-22 DIAGNOSIS — F17210 Nicotine dependence, cigarettes, uncomplicated: Secondary | ICD-10-CM | POA: Diagnosis not present

## 2018-04-22 DIAGNOSIS — Z716 Tobacco abuse counseling: Secondary | ICD-10-CM | POA: Diagnosis not present

## 2018-04-22 DIAGNOSIS — Z008 Encounter for other general examination: Secondary | ICD-10-CM | POA: Diagnosis not present

## 2018-05-06 DIAGNOSIS — F17210 Nicotine dependence, cigarettes, uncomplicated: Secondary | ICD-10-CM | POA: Diagnosis not present

## 2018-05-06 DIAGNOSIS — Z716 Tobacco abuse counseling: Secondary | ICD-10-CM | POA: Diagnosis not present

## 2018-05-23 ENCOUNTER — Ambulatory Visit (HOSPITAL_COMMUNITY)
Admission: EM | Admit: 2018-05-23 | Discharge: 2018-05-23 | Disposition: A | Discharge status: Home / Self Care | Payer: BLUE CROSS/BLUE SHIELD | Attending: Family Medicine | Admitting: Family Medicine

## 2018-05-23 ENCOUNTER — Encounter (HOSPITAL_COMMUNITY): Payer: Self-pay | Admitting: Emergency Medicine

## 2018-05-23 ENCOUNTER — Other Ambulatory Visit: Payer: Self-pay

## 2018-05-23 DIAGNOSIS — H9201 Otalgia, right ear: Secondary | ICD-10-CM | POA: Diagnosis not present

## 2018-05-23 DIAGNOSIS — H6982 Other specified disorders of Eustachian tube, left ear: Secondary | ICD-10-CM

## 2018-05-23 MED ORDER — TRIAMCINOLONE ACETONIDE 55 MCG/ACT NA AERO: 2.0000 | INHALATION_SPRAY | Freq: Every day | NASAL | 0 refills | Status: DC

## 2018-05-23 NOTE — Discharge Instructions (Addendum)
Drink plenty of fluids Use the nasacort as directed

## 2018-05-23 NOTE — ED Provider Notes (Signed)
[facility]    CSN: [id number] Arrival date & time: 05/23/18  1050     History   Chief Complaint Chief Complaint  Patient presents with  . Otalgia    HPI Heidi Owens is a 33 y.o. female.   HPI Patient has decreased hearing in her right ear for the last 3 days.  She went to a nurse at her work.  The nurse that she had fluid behind her ear.  She recommended an antihistamine.  This made her drowsy but does not make her ear feel any better.  No pain.  No fever.  No cough or cold symptoms.  No allergy symptoms of runny stuffy nose.  No prior ear problems Past Medical History:  Diagnosis Date  . Medical history non-contributory     There are no active problems to display for this patient.   Past Surgical History:  Procedure Laterality Date  . NO PAST SURGERIES      OB History    Gravida  1   Para  0   Term  0   Preterm  0   AB  0   Living  0     SAB  0   TAB  0   Ectopic  0   Multiple  0   Live Births  0            Home Medications    Prior to Admission medications   Medication Sig Start Date End Date Taking? Authorizing Provider  acetaminophen (TYLENOL) 325 MG tablet Take 325 mg by mouth every 6 (six) hours as needed for headache.    [provider]  ibuprofen (ADVIL,MOTRIN) 200 MG tablet Take 400 mg by mouth every 6 (six) hours as needed for headache.    [provider]  triamcinolone (NASACORT) 55 MCG/ACT AERO nasal inhaler Place 2 sprays into the nose daily. 05/23/18   Eustace Moore, MD    Family History Family History  Problem Relation Age of Onset  . Healthy Mother   . Healthy Father     Social History Social History   Tobacco Use  . Smoking status: Former Smoker    Packs/day: 0.50    Types: Cigarettes    Last attempt to quit: 07/08/2017    Years since quitting: 0.8  . Smokeless tobacco: Never Used  Substance Use Topics  . Alcohol use: Yes    Comment: occ  . Drug use: No     Allergies    Patient has no known allergies.   Review of Systems Review of Systems  Constitutional: Negative for chills and fever.  HENT: Positive for hearing loss. Negative for ear pain and sore throat.   Eyes: Negative for pain and visual disturbance.  Respiratory: Negative for cough and shortness of breath.   Cardiovascular: Negative for chest pain and palpitations.  Gastrointestinal: Negative for abdominal pain and vomiting.  Genitourinary: Negative for dysuria and hematuria.  Musculoskeletal: Negative for arthralgias and back pain.  Skin: Negative for color change and rash.  Neurological: Negative for seizures and syncope.  All other systems reviewed and are negative.    Physical Exam Triage Vital Signs ED Triage Vitals  Enc Vitals Group     BP 05/23/18 1125 128/74     Pulse Rate 05/23/18 1125 60     Resp 05/23/18 1125 16     Temp 05/23/18 1125 98.1 F (36.7 C)     Temp Source 05/23/18 1125 Oral  SpO2 05/23/18 1125 100 %     Weight --      Height --      Head Circumference --      Peak Flow --      Pain Score 05/23/18 1126 0     Pain Loc --      Pain Edu? --      Excl. in GC? --    No data found.  Updated Vital Signs BP 128/74 (BP Location: Right Arm)   Pulse 60   Temp 98.1 F (36.7 C) (Oral)   Resp 16   LMP 05/07/2018   SpO2 100%   Visual Acuity Right Eye Distance:   Left Eye Distance:   Bilateral Distance:    Right Eye Near:   Left Eye Near:    Bilateral Near:     Physical Exam Constitutional:      General: She is not in acute distress.    Appearance: She is well-developed.  HENT:     Head: Normocephalic and atraumatic.     Right Ear: Ear canal and external ear normal.     Left Ear: Tympanic membrane, ear canal and external ear normal.     Ears:     Comments: Serous fluid distorts light reflex    Nose: No congestion.     Mouth/Throat:     Mouth: Mucous membranes are moist.     Pharynx: No posterior oropharyngeal erythema.  Eyes:      Conjunctiva/sclera: Conjunctivae normal.     Pupils: Pupils are equal, round, and reactive to light.  Neck:     Musculoskeletal: Normal range of motion.  Cardiovascular:     Rate and Rhythm: Normal rate.  Pulmonary:     Effort: Pulmonary effort is normal. No respiratory distress.  Abdominal:     General: There is no distension.     Palpations: Abdomen is soft.  Musculoskeletal: Normal range of motion.  Lymphadenopathy:     Cervical: No cervical adenopathy.  Skin:    General: Skin is warm and dry.  Neurological:     Mental Status: She is alert.      UC Treatments / Results  Labs (all labs ordered are listed, but only abnormal results are displayed) Labs Reviewed - No data to display  EKG None  Radiology No results found.  Procedures Procedures (including critical care time)  Medications Ordered in UC Medications - No data to display  Initial Impression / Assessment and Plan / UC Course  I have reviewed the triage vital signs and the nursing notes.  Pertinent labs & imaging results that were available during my care of the patient were reviewed by me and considered in my medical decision making (see chart for details).      Final Clinical Impressions(s) / UC Diagnoses   Final diagnoses:  Otalgia of right ear  Eustachian tube dysfunction, left     Discharge Instructions     Drink plenty of fluids Use the nasacort as directed    ED Prescriptions    Medication Sig Dispense Auth. Provider   triamcinolone (NASACORT) 55 MCG/ACT AERO nasal inhaler Place 2 sprays into the nose daily. 1 Inhaler Eustace Moore, MD     Controlled Substance Prescriptions [place] Controlled Substance Registry consulted? Not Applicable   Eustace Moore, MD 05/23/18 [phone]

## 2018-05-23 NOTE — ED Triage Notes (Signed)
Pt presents to [facility] for assessment of diminished hearing in right ear x 3 days, no relief with OTC drops

## 2018-06-27 ENCOUNTER — Other Ambulatory Visit: Payer: Self-pay

## 2018-06-27 ENCOUNTER — Ambulatory Visit (HOSPITAL_COMMUNITY)
Admission: EM | Admit: 2018-06-27 | Discharge: 2018-06-27 | Disposition: A | Discharge status: Home / Self Care | Payer: BLUE CROSS/BLUE SHIELD | Attending: Family Medicine | Admitting: Family Medicine

## 2018-06-27 ENCOUNTER — Encounter (HOSPITAL_COMMUNITY): Payer: Self-pay | Admitting: Family Medicine

## 2018-06-27 DIAGNOSIS — J039 Acute tonsillitis, unspecified: Secondary | ICD-10-CM

## 2018-06-27 MED ORDER — AMOXICILLIN 875 MG PO TABS: 875.0000 mg | ORAL_TABLET | Freq: Two times a day (BID) | ORAL | 0 refills | Status: DC

## 2018-06-27 NOTE — ED Provider Notes (Signed)
[facility]    CSN: [id number] Arrival date & time: 06/27/18  1012     History   Chief Complaint Chief Complaint  Patient presents with  . Sore Throat    HPI Heidi Owens is a 33 y.o. female.   33 yo woman, an established [facility] patient, comes in today complaining of sore throat.  She was seen a month ago for eustacian tube dysfunction and prescribed a nasal steroid spray.  The throat soreness began last night.  Ear discomfort from last month cleared.  Patient works in a [employer].       Past Medical History:  Diagnosis Date  . Medical history non-contributory     There are no active problems to display for this patient.   Past Surgical History:  Procedure Laterality Date  . NO PAST SURGERIES      OB History    Gravida  1   Para  0   Term  0   Preterm  0   AB  0   Living  0     SAB  0   TAB  0   Ectopic  0   Multiple  0   Live Births  0            Home Medications    Prior to Admission medications   Medication Sig Start Date End Date Taking? Authorizing Provider  acetaminophen (TYLENOL) 325 MG tablet Take 325 mg by mouth every 6 (six) hours as needed for headache.    [provider]  amoxicillin (AMOXIL) 875 MG tablet Take 1 tablet (875 mg total) by mouth 2 (two) times daily. 06/27/18   Elvina Sidle, MD  ibuprofen (ADVIL,MOTRIN) 200 MG tablet Take 400 mg by mouth every 6 (six) hours as needed for headache.    [provider]  triamcinolone (NASACORT) 55 MCG/ACT AERO nasal inhaler Place 2 sprays into the nose daily. 05/23/18   Eustace Moore, MD    Family History Family History  Problem Relation Age of Onset  . Healthy Mother   . Healthy Father     Social History Social History   Tobacco Use  . Smoking status: Former Smoker    Packs/day: 0.50    Types: Cigarettes    Last attempt to quit: 07/08/2017    Years since quitting: 0.9  . Smokeless tobacco: Never Used  Substance Use  Topics  . Alcohol use: Yes    Comment: occ  . Drug use: No     Allergies   Patient has no known allergies.   Review of Systems Review of Systems  Constitutional: Negative.   HENT: Positive for sore throat.   All other systems reviewed and are negative.    Physical Exam Triage Vital Signs ED Triage Vitals  Enc Vitals Group     BP      Pulse      Resp      Temp      Temp src      SpO2      Weight      Height      Head Circumference      Peak Flow      Pain Score      Pain Loc      Pain Edu?      Excl. in GC?    No data found.  Updated Vital Signs BP (!) 140/99 (BP Location: Left Arm)   Pulse (!) 110   Temp 98  F (36.7 C) (Oral)   Resp 18   LMP 06/27/2018   SpO2 98%    Physical Exam Vitals signs and nursing note reviewed.  Constitutional:      Appearance: She is well-developed and normal weight.  HENT:     Head: Normocephalic.     Right Ear: Tympanic membrane normal.     Left Ear: Tympanic membrane normal.     Mouth/Throat:     Mouth: Mucous membranes are moist. Oral lesions present.     Pharynx: Pharyngeal swelling, oropharyngeal exudate and posterior oropharyngeal erythema present. No uvula swelling.     Tonsils: Tonsillar exudate present. No tonsillar abscesses. 1+ on the right. 2+ on the left.  Eyes:     Conjunctiva/sclera: Conjunctivae normal.  Neck:     Musculoskeletal: Normal range of motion and neck supple.  Pulmonary:     Effort: Pulmonary effort is normal.  Lymphadenopathy:     Cervical: Cervical adenopathy present.  Skin:    General: Skin is warm and dry.  Neurological:     General: No focal deficit present.     Mental Status: She is alert and oriented to person, place, and time.  Psychiatric:        Mood and Affect: Mood normal.        Behavior: Behavior normal.        UC Treatments / Results  Labs (all labs ordered are listed, but only abnormal results are displayed) Labs Reviewed - No data to display  EKG None   Radiology No results found.  Procedures Procedures (including critical care time)  Medications Ordered in UC Medications - No data to display  Initial Impression / Assessment and Plan / UC Course  I have reviewed the triage vital signs and the nursing notes.  Pertinent labs & imaging results that were available during my care of the patient were reviewed by me and considered in my medical decision making (see chart for details).    Final Clinical Impressions(s) / UC Diagnoses   Final diagnoses:  Tonsillitis   Discharge Instructions   None    ED Prescriptions    Medication Sig Dispense Auth. Provider   amoxicillin (AMOXIL) 875 MG tablet Take 1 tablet (875 mg total) by mouth 2 (two) times daily. 20 tablet Elvina SidleLauenstein, Rosalva Neary, MD     Controlled Substance Prescriptions [place] Controlled Substance Registry consulted? Not Applicable   Elvina SidleLauenstein, Dhanvin Szeto, MD 06/27/18 1045

## 2018-06-27 NOTE — ED Triage Notes (Signed)
Pt presents with sore throat since last night. 

## 2018-07-21 ENCOUNTER — Encounter: Payer: BLUE CROSS/BLUE SHIELD | Admitting: Adult Health

## 2018-07-23 ENCOUNTER — Encounter: Payer: Self-pay | Admitting: Adult Health

## 2018-07-23 ENCOUNTER — Ambulatory Visit (INDEPENDENT_AMBULATORY_CARE_PROVIDER_SITE_OTHER): Payer: BLUE CROSS/BLUE SHIELD | Admitting: Adult Health

## 2018-07-23 ENCOUNTER — Other Ambulatory Visit: Payer: Self-pay

## 2018-07-23 VITALS — Ht 65.0 in

## 2018-07-23 DIAGNOSIS — Z3201 Encounter for pregnancy test, result positive: Secondary | ICD-10-CM | POA: Diagnosis not present

## 2018-07-23 DIAGNOSIS — O0910 Supervision of pregnancy with history of ectopic or molar pregnancy, unspecified trimester: Secondary | ICD-10-CM | POA: Diagnosis not present

## 2018-07-23 DIAGNOSIS — O3680X0 Pregnancy with inconclusive fetal viability, not applicable or unspecified: Secondary | ICD-10-CM

## 2018-07-23 DIAGNOSIS — Z3A01 Less than 8 weeks gestation of pregnancy: Secondary | ICD-10-CM

## 2018-07-23 MED ORDER — PNV PRENATAL PLUS MULTIVITAMIN 27-1 MG PO TABS: ORAL_TABLET | ORAL | 12 refills | Status: DC

## 2018-07-23 NOTE — Progress Notes (Signed)
Patient ID: Heidi Owens, female   DOB: 1985/03/17, 33 y.o.   MRN: [id number]   TELEHEALTH VIRTUAL GYNECOLOGY VISIT ENCOUNTER NOTE  I connected with Heidi Owens on 07/23/18 at  9:45 AM EDT by telephone at home and verified that I am speaking with the correct person using two identifiers.   I discussed the limitations, risks, security and privacy concerns of performing an evaluation and management service by telephone and the availability of in person appointments. I also discussed with the patient that there may be a patient responsible charge related to this service. The patient expressed understanding and agreed to proceed.   History:  Heidi Owens is a 33 y.o. G52P0010 female being evaluated today for missing a period and had 3+HPTs, so she is about 4+2 weeks by LMP with EDD 03/30/2019. She spotted last week, none now.She denies any nausea, vomiting, abnormal vaginal discharge, pelvic pain or other concerns.    She had right ectopic last year and was given MTX.    Past Medical History:  Diagnosis Date  . Medical history non-contributory    Past Surgical History:  Procedure Laterality Date  . NO PAST SURGERIES     The following portions of the patient's history were reviewed and updated as appropriate: allergies, current medications, past family history, past medical history, past social history, past surgical history and problem list.   Health Maintenance: pap at new OB  Review of Systems:  Pertinent items noted in HPI and remainder of comprehensive ROS otherwise negative.  Physical Exam:   General:  Alert, oriented and cooperative.   Mental Status: Normal mood and affect perceived. Normal judgment and thought content.  Physical exam deferred due to nature of the encounter Ht 5\' 5"  (1.651 m)   LMP 06/24/2018   Breastfeeding No   BMI 24.63 kg/m per pt. Fall risk is low PHQ 2 score 0.  She is aware babies delivered at [facility].  Labs and Imaging No results found for this or any  previous visit (from the past 336 hour(s)). No results found.    Assessment and Plan:     1. Positive pregnancy test 3+HPTs   2. Less than [redacted] weeks gestation of pregnancy Meds ordered this encounter  Medications  . Prenatal Vit-Fe Fumarate-FA (PNV PRENATAL PLUS MULTIVITAMIN) 27-1 MG TABS    Sig: Take 1 daily    Dispense:  30 tablet    Refill:  12    Order Specific Question:   Supervising Provider    Answer:   Despina Hidden, LUTHER H [[id number]]    3. Encounter to determine fetal viability of pregnancy, single or unspecified fetus Will get dating SU in about 3 weeks  - US OB Comp Less 14 Wks; Future  4. Pregnancy with history of ectopic pregnancy, antepartum        I discussed the assessment and treatment plan with the patient. The patient was provided an opportunity to ask questions and all were answered. The patient agreed with the plan and demonstrated an understanding of the instructions.   The patient was advised to call back or seek an in-person evaluation/go to the ED if the symptoms worsen or if the condition fails to improve as anticipated.  I provided 9 minutes of non-face-to-face time during this encounter.   Cyril Mourning, NP [facility] [employer], [facility]

## 2018-07-30 ENCOUNTER — Encounter (HOSPITAL_COMMUNITY): Payer: Self-pay | Admitting: Emergency Medicine

## 2018-07-30 ENCOUNTER — Emergency Department (HOSPITAL_COMMUNITY): Payer: BLUE CROSS/BLUE SHIELD

## 2018-07-30 ENCOUNTER — Ambulatory Visit (HOSPITAL_COMMUNITY)
Admission: EM | Admit: 2018-07-30 | Discharge: 2018-07-31 | Disposition: A | Discharge status: Home / Self Care | Payer: BLUE CROSS/BLUE SHIELD | Attending: Emergency Medicine | Admitting: Emergency Medicine

## 2018-07-30 ENCOUNTER — Other Ambulatory Visit: Payer: Self-pay

## 2018-07-30 DIAGNOSIS — Z1159 Encounter for screening for other viral diseases: Secondary | ICD-10-CM | POA: Diagnosis not present

## 2018-07-30 DIAGNOSIS — Z3A01 Less than 8 weeks gestation of pregnancy: Secondary | ICD-10-CM | POA: Diagnosis not present

## 2018-07-30 DIAGNOSIS — Z87891 Personal history of nicotine dependence: Secondary | ICD-10-CM | POA: Diagnosis not present

## 2018-07-30 DIAGNOSIS — O00101 Right tubal pregnancy without intrauterine pregnancy: Secondary | ICD-10-CM | POA: Insufficient documentation

## 2018-07-30 DIAGNOSIS — O26841 Uterine size-date discrepancy, first trimester: Secondary | ICD-10-CM | POA: Diagnosis not present

## 2018-07-30 DIAGNOSIS — Z03818 Encounter for observation for suspected exposure to other biological agents ruled out: Secondary | ICD-10-CM | POA: Diagnosis not present

## 2018-07-30 LAB — CBC WITH DIFFERENTIAL/PLATELET
Abs Immature Granulocytes: 0.04 10*3/uL (ref 0.00–0.07)
Basophils Absolute: 0.1 10*3/uL (ref 0.0–0.1)
Basophils Relative: 1 %
Eosinophils Absolute: 0.3 10*3/uL (ref 0.0–0.5)
Eosinophils Relative: 4 %
HCT: 40.8 % (ref 36.0–46.0)
Hemoglobin: 13.3 g/dL (ref 12.0–15.0)
Immature Granulocytes: 0 %
Lymphocytes Relative: 30 %
Lymphs Abs: 2.8 10*3/uL (ref 0.7–4.0)
MCH: 31.4 pg (ref 26.0–34.0)
MCHC: 32.6 g/dL (ref 30.0–36.0)
MCV: 96.2 fL (ref 80.0–100.0)
Monocytes Absolute: 0.6 10*3/uL (ref 0.1–1.0)
Monocytes Relative: 6 %
Neutro Abs: 5.5 10*3/uL (ref 1.7–7.7)
Neutrophils Relative %: 59 %
Platelets: 258 10*3/uL (ref 150–400)
RBC: 4.24 MIL/uL (ref 3.87–5.11)
RDW: 13.5 % (ref 11.5–15.5)
WBC: 9.3 10*3/uL (ref 4.0–10.5)
nRBC: 0 % (ref 0.0–0.2)

## 2018-07-30 LAB — BASIC METABOLIC PANEL
Anion gap: 12 (ref 5–15)
BUN: 9 mg/dL (ref 6–20)
CO2: 24 mmol/L (ref 22–32)
Calcium: 9.4 mg/dL (ref 8.9–10.3)
Chloride: 102 mmol/L (ref 98–111)
Creatinine, Ser: 0.65 mg/dL (ref 0.44–1.00)
GFR calc Af Amer: >60 mL/min (ref >60)
GFR calc non Af Amer: >60 mL/min (ref >60)
Glucose, Bld: 101 mg/dL — ABNORMAL HIGH (ref 70–99)
Potassium: 3.4 mmol/L — ABNORMAL LOW (ref 3.5–5.1)
Sodium: 138 mmol/L (ref 135–145)

## 2018-07-30 LAB — URINALYSIS, ROUTINE W REFLEX MICROSCOPIC
Bacteria, UA: NONE SEEN
Bilirubin Urine: NEGATIVE
Glucose, UA: NEGATIVE mg/dL
Hgb urine dipstick: NEGATIVE
Ketones, ur: NEGATIVE mg/dL
Nitrite: NEGATIVE
Protein, ur: NEGATIVE mg/dL
Specific Gravity, Urine: 1.027 (ref 1.005–1.030)
pH: 6 (ref 5.0–8.0)

## 2018-07-30 LAB — WET PREP, GENITAL
Clue Cells Wet Prep HPF POC: NONE SEEN
Sperm: NONE SEEN
Yeast Wet Prep HPF POC: NONE SEEN

## 2018-07-30 LAB — POC URINE PREG, ED: Preg Test, Ur: POSITIVE — AB

## 2018-07-30 LAB — ABO/RH: ABO/RH(D): B POS

## 2018-07-30 NOTE — ED Provider Notes (Signed)
[facility] EMERGENCY DEPARTMENT Provider Note   CSN: [id number] Arrival date & time: 07/30/18  2130    History   Chief Complaint Chief Complaint  Patient presents with  . Vaginal Bleeding    [redacted] weeks pregnant    HPI Heidi Owens is a 33 y.o. female.     Patient presents with concern for vaginal bleeding in the setting of early pregnancy.  States she is [redacted] weeks pregnant by dates.  Her last menstrual cycle was April 16.  States she developed some spotting tonight that was pink when she wiped.  There is no hematuria or pain when she urinated.  She denies any significant vaginal bleeding but has had some pink spotting.  She is concerned because she had ectopic pregnancy about 1 year ago that was treated medically.  No previous abdominal surgeries.  Denies any cramping or abdominal pain.  No fevers, chills, nausea, vomiting.  No back pain.  She had a positive pregnancy test at home but has not seen OB about her pregnancy yet.  Denies any dizziness or lightheadedness.  The history is provided by the patient.    Past Medical History:  Diagnosis Date  . Medical history non-contributory     Patient Active Problem List   Diagnosis Date Noted  . Positive pregnancy test 07/23/2018  . Less than [redacted] weeks gestation of pregnancy 07/23/2018  . Encounter to determine fetal viability of pregnancy 07/23/2018  . Pregnancy with history of ectopic pregnancy, antepartum 07/23/2018    Past Surgical History:  Procedure Laterality Date  . NO PAST SURGERIES       OB History    Gravida  2   Para  0   Term  0   Preterm  0   AB  1   Living  0     SAB  0   TAB  0   Ectopic  1   Multiple  0   Live Births  0            Home Medications    Prior to Admission medications   Medication Sig Start Date End Date Taking? Authorizing Provider  Prenatal Vit-Fe Fumarate-FA (PNV PRENATAL PLUS MULTIVITAMIN) 27-1 MG TABS Take 1 daily 07/23/18   Adline PotterGriffin, Jennifer A, NP    Family  History Family History  Problem Relation Age of Onset  . Hypertension Mother   . Healthy Father   . Cancer Maternal Grandfather     Social History Social History   Tobacco Use  . Smoking status: Former Smoker    Packs/day: 0.50    Types: Cigarettes    Last attempt to quit: 07/08/2017    Years since quitting: 1.0  . Smokeless tobacco: Never Used  Substance Use Topics  . Alcohol use: Not Currently    Comment: occ  . Drug use: No     Allergies   Other   Review of Systems Review of Systems  Constitutional: Negative for activity change, appetite change and fever.  HENT: Negative for congestion.   Eyes: Negative for visual disturbance.  Respiratory: Negative for chest tightness and shortness of breath.   Gastrointestinal: Negative for abdominal pain, nausea and vomiting.  Genitourinary: Positive for vaginal bleeding. Negative for dysuria, genital sores and menstrual problem.  Musculoskeletal: Negative for arthralgias, back pain and myalgias.  Skin: Negative for rash.  Neurological: Negative for dizziness, weakness and headaches.    all other systems are negative except as noted in the HPI and PMH.  Physical Exam Updated Vital Signs BP 139/86 (BP Location: Right Arm)   Pulse 91   Temp 98 F (36.7 C) (Oral)   Resp 18   Ht  (1.651 m)   Wt 68 kg   LMP 06/24/2018   SpO2 100%   BMI 24.96 kg/m   Physical Exam Vitals signs and nursing note reviewed.  Constitutional:      General: She is not in acute distress.    Appearance: She is well-developed.  HENT:     Head: Normocephalic and atraumatic.     Mouth/Throat:     Pharynx: No oropharyngeal exudate.  Eyes:     Conjunctiva/sclera: Conjunctivae normal.     Pupils: Pupils are equal, round, and reactive to light.  Neck:     Musculoskeletal: Normal range of motion and neck supple.     Comments: No meningismus. Cardiovascular:     Rate and Rhythm: Normal rate and regular rhythm.     Heart sounds: Normal  heart sounds. No murmur.  Pulmonary:     Effort: Pulmonary effort is normal. No respiratory distress.     Breath sounds: Normal breath sounds.  Abdominal:     Palpations: Abdomen is soft.     Tenderness: There is no abdominal tenderness. There is no guarding or rebound.  Genitourinary:    Comments: Chaperone present.  Normal external genitalia.  There is scant dark blood in vaginal vault.  Cervix is closed.  No midline or lateralize adnexal tenderness. Musculoskeletal: Normal range of motion.        General: No tenderness.  Skin:    General: Skin is warm.     Capillary Refill: Capillary refill takes less than 2 seconds.  Neurological:     General: No focal deficit present.     Mental Status: She is alert and oriented to person, place, and time. Mental status is at baseline.     Cranial Nerves: No cranial nerve deficit.     Motor: No abnormal muscle tone.     Coordination: Coordination normal.     Comments: No ataxia on finger to nose bilaterally. No pronator drift. 5/5 strength throughout. CN 2-12 intact.Equal grip strength. Sensation intact.   Psychiatric:        Behavior: Behavior normal.      ED Treatments / Results  Labs (all labs ordered are listed, but only abnormal results are displayed) Labs Reviewed  WET PREP, GENITAL - Abnormal; Notable for the following components:      Result Value   WBC, Wet Prep HPF POC MODERATE (*)    All other components within normal limits  URINALYSIS, ROUTINE W REFLEX MICROSCOPIC - Abnormal; Notable for the following components:   APPearance HAZY (*)    Leukocytes,Ua SMALL (*)    All other components within normal limits  BASIC METABOLIC PANEL - Abnormal; Notable for the following components:   Potassium 3.4 (*)    Glucose, Bld 101 (*)    All other components within normal limits  HCG, QUANTITATIVE, PREGNANCY - Abnormal; Notable for the following components:   hCG, Beta Chain, Quant, S 28,127 (*)    All other components within normal  limits  POC URINE PREG, ED - Abnormal; Notable for the following components:   Preg Test, Ur POSITIVE (*)    All other components within normal limits  SARS CORONAVIRUS 2 (HOSPITAL ORDER, PERFORMED IN [place])  CBC WITH DIFFERENTIAL/PLATELET  ABO/RH  TYPE AND SCREEN  GC/CHLAMYDIA PROBE AMP ([place]) NOT  AT [facility]    EKG None  Radiology US Ob Comp < 14 Wks  Result Date: 07/31/2018 CLINICAL DATA:  Right-sided pelvic pain and vaginal bleeding EXAM: OBSTETRIC <14 WK [place] AND TRANSVAGINAL OB US TECHNIQUE: Both transabdominal and transvaginal ultrasound examinations were performed for complete evaluation of the gestation as well as the maternal uterus, adnexal regions, and pelvic cul-de-sac. Transvaginal technique was performed to assess early pregnancy. COMPARISON:  None. FINDINGS: Intrauterine gestational sac: No intrauterine gestational sac is noted. Maternal uterus/adnexae: A gestational sac is noted within the right ovary. Yolk sac:  Present Embryo:  Present Cardiac Activity: Present Heart Rate: 124 bpm CRL: 14.4 mm 7 w 5 d                  [place] EDC: 03/14/2019 The uterus is within normal limits. The left ovary is unremarkable. Minimal free fluid is noted likely physiologic in nature. No findings to suggest rupture are noted at this time. IMPRESSION: Changes consistent with right ectopic pregnancy within the ovary corresponding to a gestational age of [redacted] weeks 5 days. Critical Value/emergent results were called by telephone at the time of interpretation on 07/31/2018 at 12:19 am to Dr. Glynn Octave , who verbally acknowledged these results. Electronically Signed   By: Alcide Clever M.D.   On: 07/31/2018 00:21   US Ob Transvaginal  Result Date: 07/31/2018 CLINICAL DATA:  Right-sided pelvic pain and vaginal bleeding EXAM: OBSTETRIC <14 WK [place] AND TRANSVAGINAL OB US TECHNIQUE: Both transabdominal and transvaginal ultrasound examinations were performed for complete evaluation of the  gestation as well as the maternal uterus, adnexal regions, and pelvic cul-de-sac. Transvaginal technique was performed to assess early pregnancy. COMPARISON:  None. FINDINGS: Intrauterine gestational sac: No intrauterine gestational sac is noted. Maternal uterus/adnexae: A gestational sac is noted within the right ovary. Yolk sac:  Present Embryo:  Present Cardiac Activity: Present Heart Rate: 124 bpm CRL: 14.4 mm 7 w 5 d                  [place] EDC: 03/14/2019 The uterus is within normal limits. The left ovary is unremarkable. Minimal free fluid is noted likely physiologic in nature. No findings to suggest rupture are noted at this time. IMPRESSION: Changes consistent with right ectopic pregnancy within the ovary corresponding to a gestational age of [redacted] weeks 5 days. Critical Value/emergent results were called by telephone at the time of interpretation on 07/31/2018 at 12:19 am to Dr. Glynn Octave , who verbally acknowledged these results. Electronically Signed   By: Alcide Clever M.D.   On: 07/31/2018 00:21    Procedures Procedures (including critical care time)  Medications Ordered in ED Medications - No data to display   Initial Impression / Assessment and Plan / ED Course  I have reviewed the triage vital signs and the nursing notes.  Pertinent labs & imaging results that were available during my care of the patient were reviewed by me and considered in my medical decision making (see chart for details).       Vaginal bleeding in setting of early pregnancy.  History of ectopic pregnancy.  Denies pain.  Vitals are stable.  Pelvic exam as above.  hCG 27,000.  Hemoglobin is stable.  Rh is positive.  Ultrasound concerning for ectopic pregnancy on the right without rupture.  Patient remains hemodynamically stable.  Discussed with Dr. Despina Hidden of gynecology.  He request transfer to [facility] the ED and he will take to the OR.  D/w  Dr. Nicanor Alcon who accepts patient to the ED. Blood pressure and  mental status remained stable at time of transfer.  CRITICAL CARE Performed by: Glynn Octave Total critical care time: 35 minutes Critical care time was exclusive of separately billable procedures and treating other patients. Critical care was necessary to treat or prevent imminent or life-threatening deterioration. Critical care was time spent personally by me on the following activities: development of treatment plan with patient and/or surrogate as well as nursing, discussions with consultants, evaluation of patient's response to treatment, examination of patient, obtaining history from patient or surrogate, ordering and performing treatments and interventions, ordering and review of laboratory studies, ordering and review of radiographic studies, pulse oximetry and re-evaluation of patient's condition.  Final Clinical Impressions(s) / ED Diagnoses   Final diagnoses:  Right tubal pregnancy without intrauterine pregnancy    ED Discharge Orders    None       Markos Theil, Jeannett Senior, MD 07/31/18 0430

## 2018-07-30 NOTE — ED Triage Notes (Signed)
Patient states she is [redacted] weeks pregnant and having some vaginal bleeding, light spotting. Patient states that she has had a hx of ectopic pregnancy 1 years ago. Patient denies any pain at this time.

## 2018-07-31 ENCOUNTER — Inpatient Hospital Stay (HOSPITAL_COMMUNITY): Payer: BLUE CROSS/BLUE SHIELD | Admitting: Anesthesiology

## 2018-07-31 ENCOUNTER — Encounter (HOSPITAL_COMMUNITY)
Admission: EM | Disposition: A | Discharge status: Home / Self Care | Payer: Self-pay | Source: Home / Self Care | Attending: Emergency Medicine

## 2018-07-31 ENCOUNTER — Encounter (HOSPITAL_COMMUNITY): Payer: Self-pay

## 2018-07-31 DIAGNOSIS — Z3A01 Less than 8 weeks gestation of pregnancy: Secondary | ICD-10-CM | POA: Diagnosis not present

## 2018-07-31 DIAGNOSIS — O009 Unspecified ectopic pregnancy without intrauterine pregnancy: Secondary | ICD-10-CM | POA: Diagnosis not present

## 2018-07-31 DIAGNOSIS — Z1159 Encounter for screening for other viral diseases: Secondary | ICD-10-CM | POA: Diagnosis not present

## 2018-07-31 DIAGNOSIS — O080 Genital tract and pelvic infection following ectopic and molar pregnancy: Secondary | ICD-10-CM | POA: Diagnosis not present

## 2018-07-31 DIAGNOSIS — O26841 Uterine size-date discrepancy, first trimester: Secondary | ICD-10-CM | POA: Diagnosis not present

## 2018-07-31 DIAGNOSIS — O00101 Right tubal pregnancy without intrauterine pregnancy: Secondary | ICD-10-CM

## 2018-07-31 DIAGNOSIS — Z87891 Personal history of nicotine dependence: Secondary | ICD-10-CM | POA: Diagnosis not present

## 2018-07-31 HISTORY — PX: DIAGNOSTIC LAPAROSCOPY WITH REMOVAL OF ECTOPIC PREGNANCY: SHX6449

## 2018-07-31 LAB — HCG, QUANTITATIVE, PREGNANCY: hCG, Beta Chain, Quant, S: 28127 m[IU]/mL — ABNORMAL HIGH (ref <5)

## 2018-07-31 LAB — SARS CORONAVIRUS 2 BY RT PCR (HOSPITAL ORDER, PERFORMED IN ~~LOC~~ HOSPITAL LAB): SARS Coronavirus 2: NEGATIVE

## 2018-07-31 LAB — TYPE AND SCREEN
ABO/RH(D): B POS
Antibody Screen: NEGATIVE

## 2018-07-31 LAB — ABO/RH: ABO/RH(D): B POS

## 2018-07-31 SURGERY — LAPAROSCOPY, WITH ECTOPIC PREGNANCY SURGICAL TREATMENT
Anesthesia: General | Laterality: Right

## 2018-07-31 MED ORDER — SUGAMMADEX SODIUM 200 MG/2ML IV SOLN
INTRAVENOUS | Status: DC | PRN
  Administered 2018-07-31: 200 mg via INTRAVENOUS

## 2018-07-31 MED ORDER — ACETAMINOPHEN 10 MG/ML IV SOLN: 1000.0000 mg | Freq: Once | INTRAVENOUS | Status: DC | PRN

## 2018-07-31 MED ORDER — ONDANSETRON 8 MG PO TBDP: 8.0000 mg | ORAL_TABLET | Freq: Three times a day (TID) | ORAL | 0 refills | Status: DC | PRN

## 2018-07-31 MED ORDER — LACTATED RINGERS IV SOLN
INTRAVENOUS | Status: DC | PRN
  Administered 2018-07-31: 06:00:00 via INTRAVENOUS

## 2018-07-31 MED ORDER — ROCURONIUM BROMIDE 10 MG/ML (PF) SYRINGE
PREFILLED_SYRINGE | INTRAVENOUS | Status: AC
  Filled 2018-07-31: qty 10

## 2018-07-31 MED ORDER — OXYCODONE HCL 5 MG/5ML PO SOLN: 5.0000 mg | Freq: Once | ORAL | Status: DC | PRN

## 2018-07-31 MED ORDER — LIDOCAINE HCL (CARDIAC) PF 100 MG/5ML IV SOSY
PREFILLED_SYRINGE | INTRAVENOUS | Status: DC | PRN
  Administered 2018-07-31: 60 mg via INTRATRACHEAL

## 2018-07-31 MED ORDER — ONDANSETRON HCL 4 MG/2ML IJ SOLN
INTRAMUSCULAR | Status: DC | PRN
  Administered 2018-07-31: 4 mg via INTRAVENOUS

## 2018-07-31 MED ORDER — FENTANYL CITRATE (PF) 250 MCG/5ML IJ SOLN
INTRAMUSCULAR | Status: DC | PRN
  Administered 2018-07-31: 50 ug via INTRAVENOUS
  Administered 2018-07-31 (×2): 100 ug via INTRAVENOUS

## 2018-07-31 MED ORDER — PROPOFOL 10 MG/ML IV BOLUS
INTRAVENOUS | Status: AC
  Filled 2018-07-31: qty 20

## 2018-07-31 MED ORDER — DEXAMETHASONE SODIUM PHOSPHATE 10 MG/ML IJ SOLN
INTRAMUSCULAR | Status: AC
  Filled 2018-07-31: qty 1

## 2018-07-31 MED ORDER — KETOROLAC TROMETHAMINE 30 MG/ML IJ SOLN
INTRAMUSCULAR | Status: AC
  Filled 2018-07-31: qty 1

## 2018-07-31 MED ORDER — FENTANYL CITRATE (PF) 100 MCG/2ML IJ SOLN
25.0000 ug | INTRAMUSCULAR | Status: DC | PRN
  Administered 2018-07-31 (×2): 50 ug via INTRAVENOUS

## 2018-07-31 MED ORDER — SODIUM CHLORIDE 0.9 % IV BOLUS
1000.0000 mL | Freq: Once | INTRAVENOUS | Status: AC
  Administered 2018-07-31: 1000 mL via INTRAVENOUS

## 2018-07-31 MED ORDER — ONDANSETRON HCL 4 MG/2ML IJ SOLN
INTRAMUSCULAR | Status: AC
  Filled 2018-07-31: qty 2

## 2018-07-31 MED ORDER — ARTIFICIAL TEARS OPHTHALMIC OINT
TOPICAL_OINTMENT | OPHTHALMIC | Status: AC
  Filled 2018-07-31: qty 3.5

## 2018-07-31 MED ORDER — MIDAZOLAM HCL 2 MG/2ML IJ SOLN
INTRAMUSCULAR | Status: AC
  Filled 2018-07-31: qty 2

## 2018-07-31 MED ORDER — FENTANYL CITRATE (PF) 250 MCG/5ML IJ SOLN
INTRAMUSCULAR | Status: AC
  Filled 2018-07-31: qty 5

## 2018-07-31 MED ORDER — ACETAMINOPHEN 500 MG PO TABS: 1000.0000 mg | ORAL_TABLET | Freq: Once | ORAL | Status: DC | PRN

## 2018-07-31 MED ORDER — KETOROLAC TROMETHAMINE 10 MG PO TABS: 10.0000 mg | ORAL_TABLET | Freq: Three times a day (TID) | ORAL | 0 refills | Status: DC | PRN

## 2018-07-31 MED ORDER — FENTANYL CITRATE (PF) 100 MCG/2ML IJ SOLN
INTRAMUSCULAR | Status: AC
  Filled 2018-07-31: qty 2

## 2018-07-31 MED ORDER — SUCCINYLCHOLINE CHLORIDE 200 MG/10ML IV SOSY
PREFILLED_SYRINGE | INTRAVENOUS | Status: AC
  Filled 2018-07-31: qty 10

## 2018-07-31 MED ORDER — SODIUM CHLORIDE (PF) 0.9 % IJ SOLN
INTRAMUSCULAR | Status: AC
  Filled 2018-07-31: qty 10

## 2018-07-31 MED ORDER — SUCCINYLCHOLINE CHLORIDE 200 MG/10ML IV SOSY
PREFILLED_SYRINGE | INTRAVENOUS | Status: DC | PRN
  Administered 2018-07-31: 80 mg via INTRAVENOUS

## 2018-07-31 MED ORDER — ROCURONIUM BROMIDE 100 MG/10ML IV SOLN
INTRAVENOUS | Status: DC | PRN
  Administered 2018-07-31: 30 mg via INTRAVENOUS

## 2018-07-31 MED ORDER — ACETAMINOPHEN 160 MG/5ML PO SOLN: 1000.0000 mg | Freq: Once | ORAL | Status: DC | PRN

## 2018-07-31 MED ORDER — HYDROCODONE-ACETAMINOPHEN 5-325 MG PO TABS: 1.0000 | ORAL_TABLET | Freq: Four times a day (QID) | ORAL | 0 refills | Status: DC | PRN

## 2018-07-31 MED ORDER — KETOROLAC TROMETHAMINE 30 MG/ML IJ SOLN
INTRAMUSCULAR | Status: DC | PRN
  Administered 2018-07-31: 30 mg via INTRAVENOUS

## 2018-07-31 MED ORDER — LIDOCAINE 2% (20 MG/ML) 5 ML SYRINGE
INTRAMUSCULAR | Status: AC
  Filled 2018-07-31: qty 5

## 2018-07-31 MED ORDER — OXYCODONE HCL 5 MG PO TABS: 5.0000 mg | ORAL_TABLET | Freq: Once | ORAL | Status: DC | PRN

## 2018-07-31 MED ORDER — CEFAZOLIN SODIUM-DEXTROSE 2-3 GM-%(50ML) IV SOLR
INTRAVENOUS | Status: DC | PRN
  Administered 2018-07-31: 2 g via INTRAVENOUS

## 2018-07-31 MED ORDER — DEXAMETHASONE SODIUM PHOSPHATE 10 MG/ML IJ SOLN
INTRAMUSCULAR | Status: DC | PRN
  Administered 2018-07-31: 10 mg via INTRAVENOUS

## 2018-07-31 MED ORDER — PROPOFOL 10 MG/ML IV BOLUS
INTRAVENOUS | Status: DC | PRN
  Administered 2018-07-31: 140 mg via INTRAVENOUS

## 2018-07-31 MED ORDER — MIDAZOLAM HCL 5 MG/5ML IJ SOLN
INTRAMUSCULAR | Status: DC | PRN
  Administered 2018-07-31: 2 mg via INTRAVENOUS

## 2018-07-31 SURGICAL SUPPLY — 42 items
ADH SKN CLS APL DERMABOND .7 (GAUZE/BANDAGES/DRESSINGS) ×2
BAG SPEC RTRVL LRG 6X4 10 (ENDOMECHANICALS) ×2
BARRIER ADHS 3X4 INTERCEED (GAUZE/BANDAGES/DRESSINGS) IMPLANT
BRR ADH 4X3 ABS CNTRL BYND (GAUZE/BANDAGES/DRESSINGS)
COVER MAYO STAND STRL (DRAPES) ×4 IMPLANT
COVER WAND RF STERILE (DRAPES) ×4 IMPLANT
DECANTER SPIKE VIAL GLASS SM (MISCELLANEOUS) ×4 IMPLANT
DERMABOND ADVANCED (GAUZE/BANDAGES/DRESSINGS) ×2
DERMABOND ADVANCED .7 DNX12 (GAUZE/BANDAGES/DRESSINGS) ×1 IMPLANT
DRSG OPSITE POSTOP 3X4 (GAUZE/BANDAGES/DRESSINGS) ×3 IMPLANT
ELECT REM PT RETURN 9FT ADLT (ELECTROSURGICAL) ×4
ELECTRODE REM PT RTRN 9FT ADLT (ELECTROSURGICAL) ×2 IMPLANT
FILTER SMOKE EVAC LAPAROSHD (FILTER) ×3 IMPLANT
GLOVE BIOGEL PI IND STRL 7.0 (GLOVE) ×2 IMPLANT
GLOVE BIOGEL PI IND STRL 8 (GLOVE) ×2 IMPLANT
GLOVE BIOGEL PI INDICATOR 7.0 (GLOVE) ×2
GLOVE BIOGEL PI INDICATOR 8 (GLOVE) ×2
GLOVE ECLIPSE 8.0 STRL XLNG CF (GLOVE) ×4 IMPLANT
GOWN STRL REUS W/ TWL LRG LVL3 (GOWN DISPOSABLE) ×4 IMPLANT
GOWN STRL REUS W/TWL LRG LVL3 (GOWN DISPOSABLE) ×8
HIBICLENS CHG 4% 4OZ BTL (MISCELLANEOUS) ×4 IMPLANT
NDL INSUFFLATION 14GA 120MM (NEEDLE) ×1 IMPLANT
NEEDLE INSUFFLATION 14GA 120MM (NEEDLE) ×4 IMPLANT
PACK LAPAROSCOPY BASIN (CUSTOM PROCEDURE TRAY) ×4 IMPLANT
PACK TRENDGUARD 450 HYBRID PRO (MISCELLANEOUS) ×1 IMPLANT
POUCH SPECIMEN RETRIEVAL 10MM (ENDOMECHANICALS) ×3 IMPLANT
PROTECTOR NERVE ULNAR (MISCELLANEOUS) ×8 IMPLANT
SET IRRIG TUBING LAPAROSCOPIC (IRRIGATION / IRRIGATOR) ×3 IMPLANT
SET TUBE SMOKE EVAC HIGH FLOW (TUBING) ×4 IMPLANT
SHEARS HARMONIC ACE PLUS 36CM (ENDOMECHANICALS) ×3 IMPLANT
SLEEVE ENDOPATH XCEL 5M (ENDOMECHANICALS) ×4 IMPLANT
STAPLER VISISTAT 35W (STAPLE) IMPLANT
SUT VIC AB 3-0 FS2 27 (SUTURE) IMPLANT
SUT VIC AB 3-0 SH 27 (SUTURE)
SUT VIC AB 3-0 SH 27X BRD (SUTURE) IMPLANT
SUT VICRYL 0 UR6 27IN ABS (SUTURE) ×3 IMPLANT
TOWEL GREEN STERILE FF (TOWEL DISPOSABLE) ×8 IMPLANT
TRAY FOLEY W/BAG SLVR 14FR (SET/KITS/TRAYS/PACK) ×4 IMPLANT
TRENDGUARD 450 HYBRID PRO PACK (MISCELLANEOUS) ×4
TROCAR BALLN 12MMX100 BLUNT (TROCAR) IMPLANT
TROCAR XCEL NON-BLD 11X100MML (ENDOMECHANICALS) ×4 IMPLANT
TROCAR XCEL NON-BLD 5MMX100MML (ENDOMECHANICALS) ×4 IMPLANT

## 2018-07-31 NOTE — ED Notes (Signed)
Report given to Jasa with [facility] 

## 2018-07-31 NOTE — Anesthesia Postprocedure Evaluation (Signed)
Anesthesia Post Note  Patient: Heidi Owens  Procedure(s) Performed: LAPAROSCOPY ECTOPIC REMOVAL (Right )     Patient location during evaluation: PACU Anesthesia Type: General Level of consciousness: sedated Pain management: pain level controlled Vital Signs Assessment: post-procedure vital signs reviewed and stable Respiratory status: spontaneous breathing and respiratory function stable Cardiovascular status: stable Postop Assessment: no apparent nausea or vomiting Anesthetic complications: no    Last Vitals:  Vitals:   07/31/18 0705 07/31/18 0720  BP: 116/84 113/73  Pulse: 74 61  Resp: 19 13  Temp:    SpO2: 100% 98%    Last Pain:  Vitals:   07/31/18 0726  TempSrc:   PainSc: 4                  Angeliyah Kirkey DANIEL

## 2018-07-31 NOTE — Op Note (Signed)
Preoperative diagnosis: Right ectopic pregnancy 7 weeks and 5 days of viable  Postoperative diagnosis: Same as above with no rupture  Procedure: Laparoscopic right salpingectomy for removal of 7-week and 5-day viable right ectopic pregnancy   Surgeon: Lazaro Arms   Anesthesia: Gen. Endotracheal   Findings:  Patient presented to the emergency department at [facility] for light bleeding she was known to be early pregnant The patient was evaluated and a sonogram revealed a right adnexal pregnancy 7 weeks and 5 days it was called ovarian however true ovarian pregnancies are very very rare and that reason the lining well is the sonographic appearance I felt like it was a right tubal pregnancy.  I am on call in [place] so I had the patient transferred [facility] to perform the surgery Intraoperatively the patient indeed had a large right fallopian tube ectopic pregnancy The right ovary was normal The left tube and ovary were normal The pregnancy was unruptured  Description of operation:  Patient was taken to the operating room where she was placed in the supine position and underwent general endotracheal anesthesia She was then placed in low lithotomy position She was prepped and draped in usual sterile fashion a Foley catheter was placed  Incision was made in the umbilicus there is needle was placed into the peritoneal cavity with 1 pass that difficulty The peritoneal cavity was insufflated And 11 mm non-bladed trocar was then used with the video laparoscope and placed into the peritoneal cavity with 1 pass light difficulty Small incisions were made in the right and left lower quadrants and 5 mm trochars were placed under direct visualization without difficulty The right ectopic pregnancy was identified and was intact and the fallopian tube It was 7 weeks and 5 days by crown-rump length and was quite large the tube was not  salvageable a linear salpingostomy would have been inappropriate surgical management As a result he performed a right salpingectomy using the harmonic scalpel which was completely hemostatic The pregnancy was then placed in the Endo Catch bag and removed from the peritoneal cavity The pedicle was found to be hemostatic Again all the other anatomy was normal The 5 mm trochars were also removed and the gas was allowed to escape from the abdomen The umbilical fascia was closed with a single 0 Vicryl suture The 3 skin incisions were closed using 3-0 Vicryl in a subcuticular fashion and Dermabond  The patient tolerated the procedure well she experienced no intraoperative blood loss and there was no hemoperitoneum She received 2 g of Ancef and 30 mg of Toradol perioperatively prophylactically  Taken the recovery room good stable condition and will be discharged from the PACU later this morning followed at my office next week for routine postoperative visit  Lazaro Arms, MD 07/31/2018 6:45 AM

## 2018-07-31 NOTE — H&P (Signed)
Preoperative History and Physical  Heidi Owens is a 33 y.o. G2P0010 with Patient's last menstrual period was 06/24/2018. admitted for a laparoscopy for [redacted]w[redacted]d viable right ectopic pregnancy report states ovarian but I suspect it will be tubal    PMH:    Past Medical History:  Diagnosis Date  . Medical history non-contributory     PSH:     Past Surgical History:  Procedure Laterality Date  . NO PAST SURGERIES      POb/GynH:      OB History    Gravida  2   Para  0   Term  0   Preterm  0   AB  1   Living  0     SAB  0   TAB  0   Ectopic  1   Multiple  0   Live Births  0           SH:   Social History   Tobacco Use  . Smoking status: Former Smoker    Packs/day: 0.50    Types: Cigarettes    Last attempt to quit: 07/08/2017    Years since quitting: 1.0  . Smokeless tobacco: Never Used  Substance Use Topics  . Alcohol use: Not Currently    Comment: occ  . Drug use: No    FH:    Family History  Problem Relation Age of Onset  . Hypertension Mother   . Healthy Father   . Cancer Maternal Grandfather      Allergies:  Allergies  Allergen Reactions  . Other     Tide-hives    Medications:      No current facility-administered medications for this encounter.   Review of Systems:   Review of Systems  Constitutional: Negative for fever, chills, weight loss, malaise/fatigue and diaphoresis.  HENT: Negative for hearing loss, ear pain, nosebleeds, congestion, sore throat, neck pain, tinnitus and ear discharge.   Eyes: Negative for blurred vision, double vision, photophobia, pain, discharge and redness.  Respiratory: Negative for cough, hemoptysis, sputum production, shortness of breath, wheezing and stridor.   Cardiovascular: Negative for chest pain, palpitations, orthopnea, claudication, leg swelling and PND.  Gastrointestinal: Positive for abdominal pain. Negative for heartburn, nausea, vomiting, diarrhea, constipation, blood in stool and melena.   Genitourinary: Negative for dysuria, urgency, frequency, hematuria and flank pain.  Musculoskeletal: Negative for myalgias, back pain, joint pain and falls.  Skin: Negative for itching and rash.  Neurological: Negative for dizziness, tingling, tremors, sensory change, speech change, focal weakness, seizures, loss of consciousness, weakness and headaches.  Endo/Heme/Allergies: Negative for environmental allergies and polydipsia. Does not bruise/bleed easily.  Psychiatric/Behavioral: Negative for depression, suicidal ideas, hallucinations, memory loss and substance abuse. The patient is not nervous/anxious and does not have insomnia.      PHYSICAL EXAM:  Blood pressure 121/71, pulse 79, temperature 98.3 F (36.8 C), resp. rate 17, height 5\' 5"  (1.651 m), weight 68 kg, last menstrual period 06/24/2018, SpO2 100 %.    Vitals reviewed. Constitutional: She is oriented to person, place, and time. She appears well-developed and well-nourished.  HENT:  Head: Normocephalic and atraumatic.  Right Ear: External ear normal.  Left Ear: External ear normal.  Nose: Nose normal.  Mouth/Throat: Oropharynx is clear and moist.  Eyes: Conjunctivae and EOM are normal. Pupils are equal, round, and reactive to light. Right eye exhibits no discharge. Left eye exhibits no discharge. No scleral icterus.  Neck: Normal range of motion. Neck supple. No tracheal deviation  present. No thyromegaly present.  Cardiovascular: Normal rate, regular rhythm, normal heart sounds and intact distal pulses.  Exam reveals no gallop and no friction rub.   No murmur heard. Respiratory: Effort normal and breath sounds normal. No respiratory distress. She has no wheezes. She has no rales. She exhibits no tenderness.  GI: Soft. Bowel sounds are normal. She exhibits no distension and no mass. There is tenderness. There is no rebound and no guarding.  Genitourinary:       Vulva is normal without lesions Vagina is pink moist without  discharge Cervix normal in appearance and pap is normal Uterus is per sonogram Adnexa is negative with normal sized ovaries by sonogram  Musculoskeletal: Normal range of motion. She exhibits no edema and no tenderness.  Neurological: She is alert and oriented to person, place, and time. She has normal reflexes. She displays normal reflexes. No cranial nerve deficit. She exhibits normal muscle tone. Coordination normal.  Skin: Skin is warm and dry. No rash noted. No erythema. No pallor.  Psychiatric: She has a normal mood and affect. Her behavior is normal. Judgment and thought content normal.    Labs: Results for orders placed or performed during the hospital encounter of 07/30/18 (from the past 336 hour(s))  CBC with Differential   Collection Time: 07/30/18 10:13 PM  Result Value Ref Range   WBC 9.3 4.0 - 10.5 K/uL   RBC 4.24 3.87 - 5.11 MIL/uL   Hemoglobin 13.3 12.0 - 15.0 g/dL   HCT 16.1 09.6 - 04.5 %   MCV 96.2 80.0 - 100.0 fL   MCH 31.4 26.0 - 34.0 pg   MCHC 32.6 30.0 - 36.0 g/dL   RDW 40.9 81.1 - 91.4 %   Platelets 258 150 - 400 K/uL   nRBC 0.0 0.0 - 0.2 %   Neutrophils Relative % 59 %   Neutro Abs 5.5 1.7 - 7.7 K/uL   Lymphocytes Relative 30 %   Lymphs Abs 2.8 0.7 - 4.0 K/uL   Monocytes Relative 6 %   Monocytes Absolute 0.6 0.1 - 1.0 K/uL   Eosinophils Relative 4 %   Eosinophils Absolute 0.3 0.0 - 0.5 K/uL   Basophils Relative 1 %   Basophils Absolute 0.1 0.0 - 0.1 K/uL   Immature Granulocytes 0 %   Abs Immature Granulocytes 0.04 0.00 - 0.07 K/uL  Basic metabolic panel   Collection Time: 07/30/18 10:13 PM  Result Value Ref Range   Sodium 138 135 - 145 mmol/L   Potassium 3.4 (L) 3.5 - 5.1 mmol/L   Chloride 102 98 - 111 mmol/L   CO2 24 22 - 32 mmol/L   Glucose, Bld 101 (H) 70 - 99 mg/dL   BUN 9 6 - 20 mg/dL   Creatinine, Ser 7.82 0.44 - 1.00 mg/dL   Calcium 9.4 8.9 - 95.6 mg/dL   GFR calc non Af Amer >60 >60 mL/min   GFR calc Af Amer >60 >60 mL/min   Anion gap  12 5 - 15  hCG, quantitative, pregnancy   Collection Time: 07/30/18 10:13 PM  Result Value Ref Range   hCG, Beta Chain, Quant, S 28,127 (H) <5 mIU/mL  Urinalysis, Routine w reflex microscopic   Collection Time: 07/30/18 10:40 PM  Result Value Ref Range   Color, Urine YELLOW YELLOW   APPearance HAZY (A) CLEAR   Specific Gravity, Urine 1.027 1.005 - 1.030   pH 6.0 5.0 - 8.0   Glucose, UA NEGATIVE NEGATIVE mg/dL   Hgb urine dipstick  NEGATIVE NEGATIVE   Bilirubin Urine NEGATIVE NEGATIVE   Ketones, ur NEGATIVE NEGATIVE mg/dL   Protein, ur NEGATIVE NEGATIVE mg/dL   Nitrite NEGATIVE NEGATIVE   Leukocytes,Ua SMALL (A) NEGATIVE   RBC / HPF 0-5 0 - 5 RBC/hpf   WBC, UA 21-50 0 - 5 WBC/hpf   Bacteria, UA NONE SEEN NONE SEEN   Squamous Epithelial / LPF 0-5 0 - 5   Mucus PRESENT   POC urine preg, ED   Collection Time: 07/30/18 10:46 PM  Result Value Ref Range   Preg Test, Ur POSITIVE (A) NEGATIVE  ABO/Rh   Collection Time: 07/30/18 10:51 PM  Result Value Ref Range   ABO/RH(D)      B POS Performed at [facility], [address] [place], [place] [address]   Wet prep, genital   Collection Time: 07/30/18 11:24 PM  Result Value Ref Range   Yeast Wet Prep HPF POC NONE SEEN NONE SEEN   Trich, Wet Prep PRESENT NONE SEEN   Clue Cells Wet Prep HPF POC NONE SEEN NONE SEEN   WBC, Wet Prep HPF POC MODERATE (A) NONE SEEN   Sperm NONE SEEN   SARS Coronavirus 2 (CEPHEID - Performed in [facility] lab), Hosp Order   Collection Time: 07/31/18 12:25 AM  Result Value Ref Range   SARS Coronavirus 2 NEGATIVE NEGATIVE  Type and screen [facility]   Collection Time: 07/31/18  3:48 AM  Result Value Ref Range   ABO/RH(D) B POS    Antibody Screen NEG    Sample Expiration      08/03/2018,2359 Performed at [facility], [address] [place], [place] [address]   ABO/Rh   Collection Time: 07/31/18  3:48 AM  Result Value Ref Range   ABO/RH(D)      B POS Performed at  [facility], [address] [place], [place] [address]     EKG: Orders placed or performed during the hospital encounter of 02/05/17  . EKG 12-Lead  . EKG 12-Lead    Imaging Studies: [place] Ob Comp < 14 Wks  Result Date: 07/31/2018 CLINICAL DATA:  Right-sided pelvic pain and vaginal bleeding EXAM: OBSTETRIC <14 WK US AND TRANSVAGINAL OB US TECHNIQUE: Both transabdominal and transvaginal ultrasound examinations were performed for complete evaluation of the gestation as well as the maternal uterus, adnexal regions, and pelvic cul-de-sac. Transvaginal technique was performed to assess early pregnancy. COMPARISON:  None. FINDINGS: Intrauterine gestational sac: No intrauterine gestational sac is noted. Maternal uterus/adnexae: A gestational sac is noted within the right ovary. Yolk sac:  Present Embryo:  Present Cardiac Activity: Present Heart Rate: 124 bpm CRL: 14.4 mm 7 w 5 d                  US EDC: 03/14/2019 The uterus is within normal limits. The left ovary is unremarkable. Minimal free fluid is noted likely physiologic in nature. No findings to suggest rupture are noted at this time. IMPRESSION: Changes consistent with right ectopic pregnancy within the ovary corresponding to a gestational age of [redacted] weeks 5 days. Critical Value/emergent results were called by telephone at the time of interpretation on 07/31/2018 at 12:19 am to Dr. Glynn OctaveSTEPHEN RANCOUR , who verbally acknowledged these results. Electronically Signed   By: Alcide CleverMark  Lukens M.D.   On: 07/31/2018 00:21   [place] Ob Transvaginal  Result Date: 07/31/2018 CLINICAL DATA:  Right-sided pelvic pain and vaginal bleeding EXAM: OBSTETRIC <14 WK US AND TRANSVAGINAL  OB US TECHNIQUE: Both transabdominal and transvaginal ultrasound examinations were performed for complete evaluation of the gestation as well as the maternal uterus, adnexal regions, and pelvic cul-de-sac. Transvaginal technique was performed to assess early pregnancy. COMPARISON:  None.  FINDINGS: Intrauterine gestational sac: No intrauterine gestational sac is noted. Maternal uterus/adnexae: A gestational sac is noted within the right ovary. Yolk sac:  Present Embryo:  Present Cardiac Activity: Present Heart Rate: 124 bpm CRL: 14.4 mm 7 w 5 d                  [place] EDC: 03/14/2019 The uterus is within normal limits. The left ovary is unremarkable. Minimal free fluid is noted likely physiologic in nature. No findings to suggest rupture are noted at this time. IMPRESSION: Changes consistent with right ectopic pregnancy within the ovary corresponding to a gestational age of [redacted] weeks 5 days. Critical Value/emergent results were called by telephone at the time of interpretation on 07/31/2018 at 12:19 am to Dr. Glynn Octave , who verbally acknowledged these results. Electronically Signed   By: Alcide Clever M.D.   On: 07/31/2018 00:21      Assessment: Right ectopic pregnancy, I suspect tubal origin not ovarian  Patient Active Problem List   Diagnosis Date Noted  . Positive pregnancy test 07/23/2018  . Less than [redacted] weeks gestation of pregnancy 07/23/2018  . Encounter to determine fetal viability of pregnancy 07/23/2018  . Pregnancy with history of ectopic pregnancy, antepartum 07/23/2018    Plan: Laparoscopic management of right ectopic pregnancy probable salpingectomy  Lazaro Arms 07/31/2018 5:30 AM

## 2018-07-31 NOTE — Discharge Instructions (Signed)
Ectopic Pregnancy ° °An ectopic pregnancy is when the fertilized egg attaches (implants) outside the uterus. Most ectopic pregnancies occur in one of the tubes where eggs travel from the ovary to the uterus (fallopian tubes), but the implanting can occur in other locations. In rare cases, ectopic pregnancies occur on the ovary, intestine, pelvis, abdomen, or cervix. In an ectopic pregnancy, the fertilized egg does not have the ability to develop into a normal, healthy baby. °A ruptured ectopic pregnancy is one in which tearing or bursting of a fallopian tube causes internal bleeding. Often, there is intense lower abdominal pain, and vaginal bleeding sometimes occurs. Having an ectopic pregnancy can be life-threatening. If this dangerous condition is not treated, it can lead to blood loss, shock, or even death. °What are the causes? °The most common cause of this condition is damage to one of the fallopian tubes. A fallopian tube may be narrowed or blocked, and that keeps the fertilized egg from reaching the uterus. °What increases the risk? °This condition is more likely to develop in women of childbearing age who have different levels of risk. The levels of risk can be divided into three categories. °High risk °· You have gone through infertility treatment. °· You have had an ectopic pregnancy before. °· You have had surgery on the fallopian tubes, or another surgical procedure, such as an abortion. °· You have had surgery to have the fallopian tubes tied (tubal ligation). °· You have problems or diseases of the fallopian tubes. °· You have been exposed to diethylstilbestrol (DES). This medicine was used until 1971, and it had effects on babies whose mothers took the medicine. °· You become pregnant while using an IUD (intrauterine device) for birth control. °Moderate risk °· You have a history of infertility. °· You have had an STI (sexually transmitted infection). °· You have a history of pelvic inflammatory  disease (PID). °· You have scarring from endometriosis. °· You have multiple sexual partners. °· You smoke. °Low risk °· You have had pelvic surgery. °· You use vaginal douches. °· You became sexually active before age 18. °What are the signs or symptoms? °Common symptoms of this condition include normal pregnancy symptoms, such as missing a period, nausea, tiredness, abdominal pain, breast tenderness, and bleeding. However, ectopic pregnancy will have additional symptoms, such as: °· Pain with intercourse. °· Irregular vaginal bleeding or spotting. °· Cramping or pain on one side or in the lower abdomen. °· Fast heartbeat, low blood pressure, and sweating. °· Passing out while having a bowel movement. °Symptoms of a ruptured ectopic pregnancy and internal bleeding may include: °· Sudden, severe pain in the abdomen and pelvis. °· Dizziness, weakness, light-headedness, or fainting. °· Pain in the shoulder or neck area. °How is this diagnosed? °This condition is diagnosed by: °· A pelvic exam to locate pain or a mass in the abdomen. °· A pregnancy test. This blood test checks for the presence as well as the specific level of pregnancy hormone in the bloodstream. °· Ultrasound. This is performed if a pregnancy test is positive. In this test, a probe is inserted into the vagina. The probe will detect a fetus, possibly in a location other than the uterus. °· Taking a sample of uterus tissue (dilation and curettage, or D&C). °· Surgery to perform a visual exam of the inside of the abdomen using a thin, lighted tube that has a tiny camera on the end (laparoscope). °· Culdocentesis. This procedure involves inserting a needle at the top of   the vagina, behind the uterus. If blood is present in this area, it may indicate that a fallopian tube is torn. How is this treated? This condition is treated with medicine or surgery. Medicine  An injection of a medicine (methotrexate) may be given to cause the pregnancy tissue to be  absorbed. This medicine may save your fallopian tube. It may be given if: ? The diagnosis is made early, with no signs of active bleeding. ? The fallopian tube has not ruptured. ? You are considered to be a good candidate for the medicine. Usually, pregnancy hormone blood levels are checked after methotrexate treatment. This is to be sure that the medicine is effective. It may take 4-6 weeks for the pregnancy to be absorbed. Most pregnancies will be absorbed by 3 weeks. Surgery  A laparoscope may be used to remove the pregnancy tissue.  If severe internal bleeding occurs, a larger cut (incision) may be made in the lower abdomen (laparotomy) to remove the fetus and placenta. This is done to stop the bleeding.  Part or all of the fallopian tube may be removed (salpingectomy) along with the fetus and placenta. The fallopian tube may also be repaired during the surgery.  In very rare circumstances, removal of the uterus (hysterectomy) may be required.  After surgery, pregnancy hormone testing may be done to be sure that there is no pregnancy tissue left. Whether your treatment is medicine or surgery, you may receive a Rho (D) immune globulin shot to prevent problems with any future pregnancy. This shot may be given if:  You are Rh-negative and the baby's father is Rh-positive.  You are Rh-negative and you do not know the Rh type of the baby's father. Follow these instructions at home:  Rest and limit your activity after the procedure for as long as told by your health care provider.  Until your health care provider says that it is safe: ? Do not lift anything that is heavier than 10 lb (4.5 kg), or the limit that your health care provider tells you. ? Avoid physical exercise and any movement that requires effort (is strenuous).  To help prevent constipation: ? Eat a healthy diet that includes fruits, vegetables, and whole grains. ? Drink 6-8 glasses of water per day. Get help right away  if:  You develop worsening pain that is not relieved by medicine.  You have: ? A fever or chills. ? Vaginal bleeding. ? Redness and swelling at the incision site. ? Nausea and vomiting.  You feel dizzy or weak.  You feel light-headed or you faint. This information is not intended to replace advice given to you by your health care provider. Make sure you discuss any questions you have with your health care provider. Document Released: 04/03/2004 Document Revised: 10/24/2015 Document Reviewed: 09/26/2015 Elsevier Interactive Patient Education  2019 [employer].   Salpingectomy Salpingectomy, also called tubectomy, is the surgical removal of one of the fallopian tubes. The fallopian tubes are where eggs travel from the ovaries to the uterus. Removing one fallopian tube does not prevent you from becoming pregnant. It also does not cause problems with your menstrual periods. You may need a salpingectomy if you:  Have a fertilized egg that attaches to the fallopian tube (ectopic pregnancy), especially one that causes the tube to burst or tear (rupture).  Have an infected fallopian tube.  Have cancer of the fallopian tube or nearby organs.  Have had an ovary removed due to a cyst or tumor.  Have had  your uterus removed. There are three different methods that can be used for a salpingectomy:  Open. This method involves making one large incision in your abdomen.  Laparoscopic. This method involves using a thin, lighted tube with a tiny camera on the end (laparoscope) to help perform the procedure. The laparoscope will allow your surgeon to make several small incisions in the abdomen instead of a large incision.  Robot-assisted: This method involves using a computer to control surgical instruments that are attached to robotic arms. Tell a health care provider about:  Any allergies you have.  All medicines you are taking, including vitamins, herbs, eye drops, creams, and  over-the-counter medicines.  Any problems you or family members have had with anesthetic medicines.  Any blood disorders you have.  Any surgeries you have had.  Any medical conditions you have.  Whether you are pregnant or may be pregnant. What are the risks? Generally, this is a safe procedure. However, problems may occur, including:  Infection.  Bleeding.  Allergic reactions to medicines.  Damage to other structures or organs.  Blood clots in the legs or lungs. What happens before the procedure? Staying hydrated Follow instructions from your health care provider about hydration, which may include:  Up to 2 hours before the procedure - you may continue to drink clear liquids, such as water, clear fruit juice, black coffee, and plain tea. Eating and drinking restrictions Follow instructions from your health care provider about eating and drinking, which may include:  8 hours before the procedure - stop eating heavy meals or foods such as meat, fried foods, or fatty foods.  6 hours before the procedure - stop eating light meals or foods, such as toast or cereal.  6 hours before the procedure - stop drinking milk or drinks that contain milk.  2 hours before the procedure - stop drinking clear liquids. Medicines  Ask your health care provider about: ? Changing or stopping your regular medicines. This is especially important if you are taking diabetes medicines or blood thinners. ? Taking medicines such as aspirin and ibuprofen. These medicines can thin your blood. Do not take these medicines before your procedure if your health care provider instructs you not to.  You may be given antibiotic medicine to help prevent infection. General instructions  Do not smoke for at least 2 weeks before your procedure. If you need help quitting, ask your health care provider.  You may have an exam or tests, such as an electrocardiogram (ECG).  You may have a blood or urine sample  taken.  Ask your health care provider: ? Whether you should stop removing hair from your surgical area. ? How your surgical site will be marked or identified.  You may be asked to shower with a germ-killing soap.  Plan to have someone take you home from the hospital or clinic.  If you will be going home right after the procedure, plan to have someone with you for 24 hours. What happens during the procedure?  To reduce your risk of infection: ? Your health care team will wash or sanitize their hands. ? Hair may be removed from the surgical area. ? Your skin will be washed with soap.  An IV tube will be inserted into one of your veins.  You will be given a medicine to make you fall asleep (general anesthetic). You may also be given a medicine to help you relax (sedative).  A thin tube (catheter) may be inserted through your urethra and  into your bladder to drain urine during your procedure.  Depending on the type of procedure you are having, one incision or several small incisions will be made in your abdomen.  Your fallopian tube will be cut and removed from where it attaches to your uterus.  Your blood vessels will be clamped and tied to prevent excess bleeding.  The incision(s) in your abdomen will be closed with stitches (sutures), staples, or skin glue.  A bandage (dressing) may be placed over your incision(s). The procedure may vary among health care providers and hospitals. What happens after the procedure?   Your blood pressure, heart rate, breathing rate, and blood oxygen level will be monitored until the medicines you were given have worn off.  You may continue to receive fluids and medicines through an IV tube.  You may continue to have a catheter draining your urine.  You may have to wear compression stockings. These stockings help to prevent blood clots and reduce swelling in your legs.  You will be given pain medicine as needed.  Do not drive for 24 hours if  you received a sedative. Summary  Salpingectomy is a surgical procedure to remove one of the fallopian tubes.  The procedure may be done with an open incision, with a laparoscope, or with computer-controlled instruments.  Depending on the type of procedure you are having, one incision or several small incisions will be made in your abdomen.  Your blood pressure, heart rate, breathing rate, and blood oxygen level will be monitored until the medicines you were given have worn off.  Plan to have someone take you home from the hospital or clinic. This information is not intended to replace advice given to you by your health care provider. Make sure you discuss any questions you have with your health care provider. Document Released: 07/13/2008 Document Revised: 10/12/2015 Document Reviewed: 08/18/2012 Elsevier Interactive Patient Education  2019 [employer].

## 2018-07-31 NOTE — Transfer of Care (Signed)
Immediate Anesthesia Transfer of Care Note  Patient: Heidi Owens  Procedure(s) Performed: LAPAROSCOPY ECTOPIC REMOVAL (Right )  Patient Location: PACU  Anesthesia Type:General  Level of Consciousness: drowsy  Airway & Oxygen Therapy: Patient Spontanous Breathing  Post-op Assessment: Report given to RN and Post -op Vital signs reviewed and stable  Post vital signs: Reviewed and stable  Last Vitals:  Vitals Value Taken Time  BP 141/93 07/31/2018  6:49 AM  Temp    Pulse 92 07/31/2018  6:50 AM  Resp 15 07/31/2018  6:50 AM  SpO2 100 % 07/31/2018  6:50 AM  Vitals shown include unvalidated device data.  Last Pain:  Vitals:   07/31/18 0316  TempSrc:   PainSc: 0-No pain         Complications: No apparent anesthesia complications

## 2018-07-31 NOTE — MAU Note (Signed)
Presents from [facility] for live ectopic seen via U/S w/o rupture.  Still having a scant amount of bleeding.  Denies pain at this time.

## 2018-07-31 NOTE — Anesthesia Preprocedure Evaluation (Signed)
Anesthesia Evaluation  Patient identified by MRN, date of birth, ID band Patient awake    Reviewed: Allergy & Precautions, NPO status , Patient's Chart, lab work & pertinent test results  History of Anesthesia Complications Negative for: history of anesthetic complications  Airway Mallampati: II  TM Distance: >3 FB Neck ROM: Full    Dental  (+) Teeth Intact   Pulmonary neg recent URI, former smoker,  Same day covid neg   breath sounds clear to auscultation       Cardiovascular negative cardio ROS   Rhythm:Regular     Neuro/Psych negative neurological ROS  negative psych ROS   GI/Hepatic negative GI ROS, Neg liver ROS,   Endo/Other  negative endocrine ROS  Renal/GU negative Renal ROS     Musculoskeletal negative musculoskeletal ROS (+)   Abdominal   Peds  Hematology negative hematology ROS (+)   Anesthesia Other Findings   Reproductive/Obstetrics (+) Pregnancy ectopic                             Anesthesia Physical Anesthesia Plan  ASA: II  Anesthesia Plan: General   Post-op Pain Management:    Induction: Intravenous, Rapid sequence and Cricoid pressure planned  PONV Risk Score and Plan: 3 and Ondansetron and Dexamethasone  Airway Management Planned: Oral ETT  Additional Equipment: None  Intra-op Plan:   Post-operative Plan: Extubation in OR  Informed Consent: I have reviewed the patients History and Physical, chart, labs and discussed the procedure including the risks, benefits and alternatives for the proposed anesthesia with the patient or authorized representative who has indicated his/her understanding and acceptance.     Dental advisory given  Plan Discussed with: CRNA and Surgeon  Anesthesia Plan Comments:         Anesthesia Quick Evaluation

## 2018-07-31 NOTE — Anesthesia Procedure Notes (Signed)
Procedure Name: Intubation Date/Time: 07/31/2018 6:02 AM Performed by: Claudina Lick, CRNA Pre-anesthesia Checklist: Patient identified, Emergency Drugs available, Suction available, Patient being monitored and Timeout performed Patient Re-evaluated:Patient Re-evaluated prior to induction Oxygen Delivery Method: Circle system utilized Preoxygenation: Pre-oxygenation with 100% oxygen Induction Type: Rapid sequence, Cricoid Pressure applied and IV induction Laryngoscope Size: Miller and 2 Grade View: Grade I Tube type: Oral Tube size: 7.0 mm Number of attempts: 1 Airway Equipment and Method: Stylet Placement Confirmation: ETT inserted through vocal cords under direct vision,  positive ETCO2 and breath sounds checked- equal and bilateral Secured at: 21 cm Tube secured with: Tape Dental Injury: Teeth and Oropharynx as per pre-operative assessment

## 2018-08-01 ENCOUNTER — Encounter (HOSPITAL_COMMUNITY): Payer: Self-pay | Admitting: Obstetrics & Gynecology

## 2018-08-03 LAB — GC/CHLAMYDIA PROBE AMP (~~LOC~~) NOT AT ARMC
Chlamydia: NEGATIVE
Neisseria Gonorrhea: NEGATIVE

## 2018-08-09 ENCOUNTER — Telehealth: Payer: Self-pay | Admitting: *Deleted

## 2018-08-09 NOTE — Telephone Encounter (Signed)
Patient informed we are still not allowing any visitors or children to come in during appointment time unless physical assistance is needed. Asked if has had any exposure to anyone suspected or confirmed of having COVID-19 or if she was experiencing any of the following, to reschedule: fever, cough, shortness of breath, muscle pain, diarrhea, rash, vomiting, abdominal pain, red eye, weakness, bruising, bleeding, joint pain, or a severe headache.  Stated no to all.  Call our office on arrival in our office parking lot to complete registration over the phone. Advised to also use the provided hand sanitizer when entering the office and to wear a mask if they have one of their own, if not, we are happy to provide one. Pt verbalized understanding.

## 2018-08-10 ENCOUNTER — Ambulatory Visit (INDEPENDENT_AMBULATORY_CARE_PROVIDER_SITE_OTHER): Payer: BC Managed Care – PPO | Admitting: Obstetrics & Gynecology

## 2018-08-10 ENCOUNTER — Encounter: Payer: Self-pay | Admitting: Obstetrics & Gynecology

## 2018-08-10 ENCOUNTER — Other Ambulatory Visit: Payer: Self-pay

## 2018-08-10 VITALS — BP 122/80 | HR 77 | Ht 65.0 in | Wt 148.0 lb

## 2018-08-10 DIAGNOSIS — Z9889 Other specified postprocedural states: Secondary | ICD-10-CM

## 2018-08-10 MED ORDER — DESOGESTREL-ETHINYL ESTRADIOL 0.15-30 MG-MCG PO TABS: 1.0000 | ORAL_TABLET | Freq: Every day | ORAL | 11 refills | Status: DC

## 2018-08-10 NOTE — Progress Notes (Signed)
  HPI: Patient returns for routine postoperative follow-up having undergone laparoscopic removal of [redacted]w[redacted]d right ectopic pregnancy on 07/31/2018.  The patient's immediate postoperative recovery has been unremarkable. Since hospital discharge the patient reports no problems.   Current Outpatient Medications: HYDROcodone-acetaminophen (NORCO/VICODIN) 5-325 MG tablet, Take 1 tablet by mouth every 6 (six) hours as needed. (Patient not taking: Reported on 08/10/2018), Disp: 15 tablet, Rfl: 0 ketorolac (TORADOL) 10 MG tablet, Take 1 tablet (10 mg total) by mouth every 8 (eight) hours as needed. (Patient not taking: Reported on 08/10/2018), Disp: 15 tablet, Rfl: 0 ondansetron (ZOFRAN ODT) 8 MG disintegrating tablet, Take 1 tablet (8 mg total) by mouth every 8 (eight) hours as needed for nausea or vomiting. (Patient not taking: Reported on 08/10/2018), Disp: 20 tablet, Rfl: 0 Prenatal Vit-Fe Fumarate-FA (PNV PRENATAL PLUS MULTIVITAMIN) 27-1 MG TABS, Take 1 daily (Patient not taking: Reported on 08/10/2018), Disp: 30 tablet, Rfl: 12  No current facility-administered medications for this visit.     Blood pressure 122/80, pulse 77, height 5\' 5"  (1.651 m), weight 148 lb (67.1 kg), last menstrual period 06/27/2018.  Physical Exam: Incision x 3 clean dry intact  Diagnostic Tests:    Pathology: ectopic  Impression: S/p laparoscopic r salpingectomy for right ectopic pregnancy  Plan:   Follow up: prn    Lazaro Arms, MD

## 2018-08-12 ENCOUNTER — Other Ambulatory Visit: Payer: BLUE CROSS/BLUE SHIELD

## 2019-06-22 ENCOUNTER — Ambulatory Visit (INDEPENDENT_AMBULATORY_CARE_PROVIDER_SITE_OTHER): Payer: BC Managed Care – PPO | Admitting: Adult Health

## 2019-06-22 ENCOUNTER — Encounter: Payer: Self-pay | Admitting: Adult Health

## 2019-06-22 ENCOUNTER — Other Ambulatory Visit: Payer: Self-pay

## 2019-06-22 VITALS — BP 137/83 | HR 99 | Ht 65.0 in | Wt 156.0 lb

## 2019-06-22 DIAGNOSIS — Z113 Encounter for screening for infections with a predominantly sexual mode of transmission: Secondary | ICD-10-CM | POA: Diagnosis not present

## 2019-06-22 DIAGNOSIS — Z3490 Encounter for supervision of normal pregnancy, unspecified, unspecified trimester: Secondary | ICD-10-CM | POA: Insufficient documentation

## 2019-06-22 DIAGNOSIS — Z8759 Personal history of other complications of pregnancy, childbirth and the puerperium: Secondary | ICD-10-CM

## 2019-06-22 DIAGNOSIS — Z3201 Encounter for pregnancy test, result positive: Secondary | ICD-10-CM

## 2019-06-22 DIAGNOSIS — N926 Irregular menstruation, unspecified: Secondary | ICD-10-CM | POA: Diagnosis not present

## 2019-06-22 LAB — POCT URINE PREGNANCY: Preg Test, Ur: POSITIVE — AB

## 2019-06-22 MED ORDER — PRENATAL PLUS IRON 29-1 MG PO TABS: ORAL_TABLET | ORAL | 12 refills | Status: DC

## 2019-06-22 NOTE — Progress Notes (Signed)
  Subjective:     Patient ID: Heidi Owens, female   DOB: 16-Mar-1985, 34 y.o.   MRN: [id number]  HPI Heidi Owens is a 34 year old black female,single in for UPT, had 5+HPTs, after having spotting this period. She had right ectopic last May.  Review of Systems  This period was light, like spotting and had positive HPT x 5 Denies any pain  Reviewed past medical,surgical, social and family history. Reviewed medications and allergies.     Objective:   Physical Exam BP 137/83 (BP Location: Left Arm, Patient Position: Sitting, Cuff Size: Normal)   Pulse 99   Ht 5\' 5"  (1.651 m)   Wt 156 lb (70.8 kg)   LMP 06/11/2019   BMI 25.96 kg/m UPT +,  Skin warm and dry. Neck: mid line trachea, normal thyroid, good ROM, no lymphadenopathy noted. Lungs: clear to ausculation bilaterally. Cardiovascular: regular rate and rhythm.Abdomen is soft and non tender Pelvic: external genitalia is normal in appearance no lesions, vagina: pink discharge without odor,urethra has no lesions or masses noted, cervix:smooth and bulbous, uterus: normal size, shape and contour, non tender, no masses felt, adnexa: no masses or tenderness noted. Bladder is non tender and no masses felt Examination chaperoned by 08/11/2019 LPN    Assessment:     1. Positive urine pregnancy test Rx PNV Meds ordered this encounter  Medications  . Prenatal Vit-Iron Carbonyl-FA (PRENATAL PLUS IRON) 29-1 MG TABS    Sig: Take 1 daily    Dispense:  30 tablet    Refill:  12    Order Specific Question:   Supervising Provider    Answer:   Stoney Bang, LUTHER H [[id number]]   2. Pregnancy, unspecified gestational age Check QHCG,will talk in am and schedule dating Despina Hidden accordingly    3. History of ectopic pregnancy   4. Screening examination for STD (sexually transmitted disease)  GC/CHL on urine    Plan:     Review handout by [facility]

## 2019-06-23 ENCOUNTER — Telehealth: Payer: Self-pay | Admitting: Adult Health

## 2019-06-23 LAB — GC/CHLAMYDIA PROBE AMP
Chlamydia trachomatis, NAA: NEGATIVE
Neisseria Gonorrhoeae by PCR: NEGATIVE

## 2019-06-23 LAB — BETA HCG QUANT (REF LAB): hCG Quant: 3110 m[IU]/mL

## 2019-06-23 MED ORDER — PNV PRENATAL PLUS MULTIVITAMIN 27-1 MG PO TABS: ORAL_TABLET | ORAL | 12 refills | Status: DC

## 2019-06-23 NOTE — Telephone Encounter (Signed)
Pt says she needs another PNV, [facility] did not have the other one and she is made aware that [id number], so about 5-[redacted] weeks pregnant, call for dating [place] in 2 weeks

## 2019-06-23 NOTE — Telephone Encounter (Signed)
Pt states that [facility] told her that the wrong prenatal vitamin was sent in for her and she is wanting to see if another prenatal can be called in.

## 2019-06-23 NOTE — Addendum Note (Signed)
Addended by: Cyril Mourning A on: 06/23/2019 10:50 AM   Modules accepted: Orders

## 2019-06-24 ENCOUNTER — Encounter (HOSPITAL_COMMUNITY): Payer: Self-pay | Admitting: *Deleted

## 2019-06-24 ENCOUNTER — Other Ambulatory Visit: Payer: Self-pay

## 2019-06-24 ENCOUNTER — Inpatient Hospital Stay (HOSPITAL_COMMUNITY)
Admission: AD | Admit: 2019-06-24 | Discharge: 2019-06-24 | Disposition: A | Discharge status: Home / Self Care | Payer: BC Managed Care – PPO | Attending: Obstetrics & Gynecology | Admitting: Obstetrics & Gynecology

## 2019-06-24 ENCOUNTER — Inpatient Hospital Stay (HOSPITAL_COMMUNITY): Payer: BC Managed Care – PPO

## 2019-06-24 DIAGNOSIS — O00202 Left ovarian pregnancy without intrauterine pregnancy: Secondary | ICD-10-CM | POA: Insufficient documentation

## 2019-06-24 DIAGNOSIS — Z6790 Unspecified blood type, Rh positive: Secondary | ICD-10-CM | POA: Insufficient documentation

## 2019-06-24 DIAGNOSIS — Z3A01 Less than 8 weeks gestation of pregnancy: Secondary | ICD-10-CM | POA: Insufficient documentation

## 2019-06-24 DIAGNOSIS — O0091 Unspecified ectopic pregnancy with intrauterine pregnancy: Secondary | ICD-10-CM | POA: Diagnosis not present

## 2019-06-24 DIAGNOSIS — R109 Unspecified abdominal pain: Secondary | ICD-10-CM | POA: Insufficient documentation

## 2019-06-24 DIAGNOSIS — Z8759 Personal history of other complications of pregnancy, childbirth and the puerperium: Secondary | ICD-10-CM | POA: Diagnosis not present

## 2019-06-24 DIAGNOSIS — Z87891 Personal history of nicotine dependence: Secondary | ICD-10-CM | POA: Diagnosis not present

## 2019-06-24 DIAGNOSIS — O26891 Other specified pregnancy related conditions, first trimester: Secondary | ICD-10-CM | POA: Diagnosis not present

## 2019-06-24 DIAGNOSIS — O26899 Other specified pregnancy related conditions, unspecified trimester: Secondary | ICD-10-CM

## 2019-06-24 DIAGNOSIS — O26851 Spotting complicating pregnancy, first trimester: Secondary | ICD-10-CM | POA: Diagnosis not present

## 2019-06-24 LAB — COMPREHENSIVE METABOLIC PANEL
ALT: 17 U/L (ref 0–44)
AST: 18 U/L (ref 15–41)
Albumin: 4.5 g/dL (ref 3.5–5.0)
Alkaline Phosphatase: 49 U/L (ref 38–126)
Anion gap: 10 (ref 5–15)
BUN: 7 mg/dL (ref 6–20)
CO2: 24 mmol/L (ref 22–32)
Calcium: 9.3 mg/dL (ref 8.9–10.3)
Chloride: 102 mmol/L (ref 98–111)
Creatinine, Ser: 0.78 mg/dL (ref 0.44–1.00)
GFR calc Af Amer: >60 mL/min (ref >60)
GFR calc non Af Amer: >60 mL/min (ref >60)
Glucose, Bld: 94 mg/dL (ref 70–99)
Potassium: 3.5 mmol/L (ref 3.5–5.1)
Sodium: 136 mmol/L (ref 135–145)
Total Bilirubin: 0.4 mg/dL (ref 0.3–1.2)
Total Protein: 7.4 g/dL (ref 6.5–8.1)

## 2019-06-24 LAB — CBC
HCT: 41.2 % (ref 36.0–46.0)
Hemoglobin: 13.6 g/dL (ref 12.0–15.0)
MCH: 31.6 pg (ref 26.0–34.0)
MCHC: 33 g/dL (ref 30.0–36.0)
MCV: 95.6 fL (ref 80.0–100.0)
Platelets: 317 10*3/uL (ref 150–400)
RBC: 4.31 MIL/uL (ref 3.87–5.11)
RDW: 12.8 % (ref 11.5–15.5)
WBC: 9.2 10*3/uL (ref 4.0–10.5)
nRBC: 0 % (ref 0.0–0.2)

## 2019-06-24 LAB — HCG, QUANTITATIVE, PREGNANCY: hCG, Beta Chain, Quant, S: 4634 m[IU]/mL — ABNORMAL HIGH (ref <5)

## 2019-06-24 MED ORDER — METHOTREXATE FOR ECTOPIC PREGNANCY
50.0000 mg/m2 | Freq: Once | INTRAMUSCULAR | Status: AC
  Administered 2019-06-24: 22:00:00 90 mg via INTRAMUSCULAR
  Filled 2019-06-24: qty 1

## 2019-06-24 NOTE — Discharge Instructions (Signed)
Ectopic Pregnancy ° °An ectopic pregnancy is when the fertilized egg attaches (implants) outside the uterus. Most ectopic pregnancies occur in one of the tubes where eggs travel from the ovary to the uterus (fallopian tubes), but the implanting can occur in other locations. In rare cases, ectopic pregnancies occur on the ovary, intestine, pelvis, abdomen, or cervix. In an ectopic pregnancy, the fertilized egg does not have the ability to develop into a normal, healthy baby. °A ruptured ectopic pregnancy is one in which tearing or bursting of a fallopian tube causes internal bleeding. Often, there is intense lower abdominal pain, and vaginal bleeding sometimes occurs. Having an ectopic pregnancy can be life-threatening. If this dangerous condition is not treated, it can lead to blood loss, shock, or even death. °What are the causes? °The most common cause of this condition is damage to one of the fallopian tubes. A fallopian tube may be narrowed or blocked, and that keeps the fertilized egg from reaching the uterus. °What increases the risk? °This condition is more likely to develop in women of childbearing age who have different levels of risk. The levels of risk can be divided into three categories. °High risk °· You have gone through infertility treatment. °· You have had an ectopic pregnancy before. °· You have had surgery on the fallopian tubes, or another surgical procedure, such as an abortion. °· You have had surgery to have the fallopian tubes tied (tubal ligation). °· You have problems or diseases of the fallopian tubes. °· You have been exposed to diethylstilbestrol (DES). This medicine was used until 1971, and it had effects on babies whose mothers took the medicine. °· You become pregnant while using an IUD (intrauterine device) for birth control. °Moderate risk °· You have a history of infertility. °· You have had an STI (sexually transmitted infection). °· You have a history of pelvic inflammatory  disease (PID). °· You have scarring from endometriosis. °· You have multiple sexual partners. °· You smoke. °Low risk °· You have had pelvic surgery. °· You use vaginal douches. °· You became sexually active before age 18. °What are the signs or symptoms? °Common symptoms of this condition include normal pregnancy symptoms, such as missing a period, nausea, tiredness, abdominal pain, breast tenderness, and bleeding. However, ectopic pregnancy will have additional symptoms, such as: °· Pain with intercourse. °· Irregular vaginal bleeding or spotting. °· Cramping or pain on one side or in the lower abdomen. °· Fast heartbeat, low blood pressure, and sweating. °· Passing out while having a bowel movement. °Symptoms of a ruptured ectopic pregnancy and internal bleeding may include: °· Sudden, severe pain in the abdomen and pelvis. °· Dizziness, weakness, light-headedness, or fainting. °· Pain in the shoulder or neck area. °How is this diagnosed? °This condition is diagnosed by: °· A pelvic exam to locate pain or a mass in the abdomen. °· A pregnancy test. This blood test checks for the presence as well as the specific level of pregnancy hormone in the bloodstream. °· Ultrasound. This is performed if a pregnancy test is positive. In this test, a probe is inserted into the vagina. The probe will detect a fetus, possibly in a location other than the uterus. °· Taking a sample of uterus tissue (dilation and curettage, or D&C). °· Surgery to perform a visual exam of the inside of the abdomen using a thin, lighted tube that has a tiny camera on the end (laparoscope). °· Culdocentesis. This procedure involves inserting a needle at the top of   the vagina, behind the uterus. If blood is present in this area, it may indicate that a fallopian tube is torn. How is this treated? This condition is treated with medicine or surgery. Medicine  An injection of a medicine (methotrexate) may be given to cause the pregnancy tissue to be  absorbed. This medicine may save your fallopian tube. It may be given if: ? The diagnosis is made early, with no signs of active bleeding. ? The fallopian tube has not ruptured. ? You are considered to be a good candidate for the medicine. Usually, pregnancy hormone blood levels are checked after methotrexate treatment. This is to be sure that the medicine is effective. It may take 4-6 weeks for the pregnancy to be absorbed. Most pregnancies will be absorbed by 3 weeks. Surgery  A laparoscope may be used to remove the pregnancy tissue.  If severe internal bleeding occurs, a larger cut (incision) may be made in the lower abdomen (laparotomy) to remove the fetus and placenta. This is done to stop the bleeding.  Part or all of the fallopian tube may be removed (salpingectomy) along with the fetus and placenta. The fallopian tube may also be repaired during the surgery.  In very rare circumstances, removal of the uterus (hysterectomy) may be required.  After surgery, pregnancy hormone testing may be done to be sure that there is no pregnancy tissue left. Whether your treatment is medicine or surgery, you may receive a Rho (D) immune globulin shot to prevent problems with any future pregnancy. This shot may be given if:  You are Rh-negative and the baby's father is Rh-positive.  You are Rh-negative and you do not know the Rh type of the baby's father. Follow these instructions at home:  Rest and limit your activity after the procedure for as long as told by your health care provider.  Until your health care provider says that it is safe: ? Do not lift anything that is heavier than 10 lb (4.5 kg), or the limit that your health care provider tells you. ? Avoid physical exercise and any movement that requires effort (is strenuous).  To help prevent constipation: ? Eat a healthy diet that includes fruits, vegetables, and whole grains. ? Drink 6-8 glasses of water per day. Get help right away  if:  You develop worsening pain that is not relieved by medicine.  You have: ? A fever or chills. ? Vaginal bleeding. ? Redness and swelling at the incision site. ? Nausea and vomiting.  You feel dizzy or weak.  You feel light-headed or you faint. This information is not intended to replace advice given to you by your health care provider. Make sure you discuss any questions you have with your health care provider. Document Revised: 02/06/2017 Document Reviewed: 09/26/2015 Elsevier Patient Education  2020 [employer]     Methotrexate Treatment for an Ectopic Pregnancy  Methotrexate is a medicine that treats an ectopic pregnancy. An ectopic pregnancy is a pregnancy in which the fetus develops outside the uterus. This kind of pregnancy can be dangerous. Methotrexate works by stopping the growth of the fertilized egg. It also helps your body absorb tissue from the egg. This takes between 2-6 weeks. Most ectopic pregnancies can be successfully treated with methotrexate if they are diagnosed early. Tell a health care provider about:  Any allergies you have.  All medicines you are taking, including vitamins, herbs, eye drops, creams, and over-the-counter medicines.  Any medical conditions you have. What are the risks? Generally,  are the risks? Generally, this is a safe treatment. However, problems may occur, including:  Nausea or vomiting or both.  Vaginal bleeding or spotting.  Diarrhea.  Abdominal cramping.  Dizziness or feeling lightheaded.  Mouth sores.  Swelling or irritation of the lining of your lungs (pneumonitis).  Liver damage.  Hair loss. There is a risk that methotrexate treatment will fail and your pregnancy will continue. There is also a risk that the ectopic pregnancy might rupture while you are using this medicine. What happens before the procedure?  Liver tests, kidney tests, and a complete blood test will be done.  Blood tests will be done to measure the pregnancy  hormone levels and to determine your blood type.  If you are Rh-negative and the father is Rh-positive or his Rh type is not known, you will be given a Rho (D) immune globulin shot. What happens during the procedure? Your health care provider may give you methotrexate by injection or in the form of a pill. Methotrexate may be given as a single dose of medicine or a series of doses, depending on your response to the treatment.  Methotrexate injections will be given by your health care provider. This is the most common way that methotrexate is used to treat an ectopic pregnancy.  If you are prescribed oral methotrexate, it is very important that you follow your health care provider's instructions on how to take oral methotrexate. Additional medicines may be needed to manage an ectopic pregnancy. The procedure may vary among health care providers and hospitals. What happens after the procedure?  You may have abdominal cramping, vaginal bleeding, and fatigue.  Blood tests will be taken at timed intervals for several days or weeks to check your pregnancy hormone levels. The blood tests will be done until the pregnancy hormone can no longer be detected in the blood.  You may need to have a surgical procedure to remove the ectopic pregnancy if methotrexate treatment fails.  Follow instructions from your health care provider on how and when to report any symptoms that may indicate a ruptured ectopic pregnancy. Summary  Methotrexate is a medicine that treats an ectopic pregnancy.  Methotrexate may be given in a single dose or a series of doses over time.  Blood tests will be taken at timed intervals for several days or weeks to check your pregnancy hormone levels. The blood tests will be done until no more pregnancy hormone is detected in the blood.  There is a risk that methotrexate treatment will fail and your pregnancy will continue. There is also a risk that the ectopic pregnancy might rupture  while you are using this medicine. This information is not intended to replace advice given to you by your health care provider. Make sure you discuss any questions you have with your health care provider. Document Revised: 02/06/2017 Document Reviewed: 04/15/2016 Elsevier Patient Education  2020 [employer] Inc.         Methotrexate Treatment for an Ectopic Pregnancy, Care After This sheet gives you information about how to care for yourself after your procedure. Your health care provider may also give you more specific instructions. If you have problems or questions, contact your health care provider. What can I expect after the procedure? After the procedure, it is common to have:  Abdominal cramping.  Vaginal bleeding.  Fatigue.  Nausea.  Vomiting.  Diarrhea. Blood tests will be taken at timed intervals for several days or weeks to check your pregnancy hormone levels. The blood tests   will be done until the pregnancy hormone can no longer be detected in the blood. Follow these instructions at home: Activity  Do not have sex until your health care provider approves.  Limit activities that take a lot of effort as told by your health care provider. Medicines  Take over the counter and prescription medicines only as told by your health care provider.  Do not take aspirin, ibuprofen, naproxen, or any other NSAIDs.  Do not take folic acid, prenatal vitamins, or other vitamins that contain folic acid. General instructions   Do not drink alcohol.  Follow instructions from your health care provider on how and when to report any symptoms that may indicate a ruptured ectopic pregnancy.  Keep all follow-up visits as told by your health care provider. This is important. Contact a health care provider if:  You have persistent nausea and vomiting.  You have persistent diarrhea.  You are having a reaction to the medicine, such as: ? Tiredness. ? Skin rash. ? Hair  loss. Get help right away if:  Your abdominal or pelvic pain gets worse.  You have more vaginal bleeding.  You feel light-headed or you faint.  You have shortness of breath.  Your heart rate increases.  You develop a cough.  You have chills.  You have a fever. Summary  After the procedure, it is common to have symptoms of abdominal cramping, vaginal bleeding and fatigue. You may also experience other symptoms.  Blood tests will be taken at timed intervals for several days or weeks to check your pregnancy hormone levels. The blood tests will be done until the pregnancy hormone can no longer be detected in the blood.  Limit strenuous activity as told by your health care provider.  Follow instructions from your health care provider on how and when to report any symptoms that may indicate a ruptured ectopic pregnancy. This information is not intended to replace advice given to you by your health care provider. Make sure you discuss any questions you have with your health care provider. Document Revised: 02/06/2017 Document Reviewed: 04/15/2016 Elsevier Patient Education  2020 [employer] Inc.  

## 2019-06-24 NOTE — MAU Provider Note (Addendum)
History     CSN: [id number]  Arrival date and time: 06/24/19 1842   First Provider Initiated Contact with Patient 06/24/19 1934      Chief Complaint  Patient presents with  . Abdominal Pain   33 y.o. G2P0010 @[redacted]w[redacted]d  by LMP presenting with LLQ pain and spotting. Reports onset of pain today. Describes as sharp and intermittent. Rates pain 4/10. Denies urinary sx. She states she started bleeding on 4/3 and though it was her period but has been bleeding since, just spotting now. Hx of right ectopic last year with salpingectomy.   OB History    Gravida  2   Para  0   Term  0   Preterm  0   AB  2   Living  0     SAB  0   TAB  0   Ectopic  2   Multiple  1   Live Births  0           Past Medical History:  Diagnosis Date  . Medical history non-contributory     Past Surgical History:  Procedure Laterality Date  . DIAGNOSTIC LAPAROSCOPY WITH REMOVAL OF ECTOPIC PREGNANCY Right 07/31/2018   Procedure: LAPAROSCOPY ECTOPIC REMOVAL;  Surgeon: Florian Buff, MD;  Location: [place];  Service: Gynecology;  Laterality: Right;  . NO PAST SURGERIES      Family History  Problem Relation Age of Onset  . Hypertension Mother   . Healthy Father   . Cancer Maternal Grandfather     Social History   Tobacco Use  . Smoking status: Former Smoker    Packs/day: 0.50    Types: Cigarettes    Quit date: 07/08/2017    Years since quitting: 1.9  . Smokeless tobacco: Never Used  Substance Use Topics  . Alcohol use: Not Currently    Comment: occ  . Drug use: No    Allergies:  Allergies  Allergen Reactions  . Other     Tide-hives    Medications Prior to Admission  Medication Sig Dispense Refill Last Dose  . Prenatal Vit-Fe Fumarate-FA (PNV PRENATAL PLUS MULTIVITAMIN) 27-1 MG TABS Take 1 daily 30 tablet 12 06/24/2019 at Unknown time    Review of Systems  Constitutional: Negative for chills and fever.  Gastrointestinal: Positive for abdominal pain. Negative for nausea and  vomiting.  Genitourinary: Positive for vaginal bleeding. Negative for dysuria, frequency and hematuria.   Physical Exam   Blood pressure 137/76, pulse 78, temperature 98.3 F (36.8 C), temperature source Oral, resp. rate 20, height 5\' 5"  (1.651 m), weight 70.7 kg, last menstrual period 05/13/2019, unknown if currently breastfeeding.  Patient Vitals for the past 24 hrs:  BP Temp Temp src Pulse Resp Height Weight  06/24/19 1922 137/76 98.3 F (36.8 C) Oral 78 20 5\' 5"  (1.651 m) 70.7 kg   Physical Exam  Nursing note and vitals reviewed. Constitutional: She is oriented to person, place, and time. She appears well-developed and well-nourished. No distress.  HENT:  Head: Normocephalic and atraumatic.  Cardiovascular: Normal rate.  Respiratory: Effort normal. No respiratory distress.  GI: Soft. She exhibits no distension and no mass. There is no abdominal tenderness. There is no rebound and no guarding.  Genitourinary:    Genitourinary Comments: Uterus: non enlarged, anteverted, non tender, no CMT Adnexae: no masses, + tenderness left, no tenderness right Cervix closed    Musculoskeletal:        General: Normal range of motion.     Cervical back:  Normal range of motion.  Neurological: She is alert and oriented to person, place, and time.  Skin: Skin is warm and dry.   Results for orders placed or performed during the hospital encounter of 06/24/19 (from the past 24 hour(s))  CBC     Status: None   Collection Time: 06/24/19  7:41 PM  Result Value Ref Range   WBC 9.2 4.0 - 10.5 K/uL   RBC 4.31 3.87 - 5.11 MIL/uL   Hemoglobin 13.6 12.0 - 15.0 g/dL   HCT 58.0 99.8 - 33.8 %   MCV 95.6 80.0 - 100.0 fL   MCH 31.6 26.0 - 34.0 pg   MCHC 33.0 30.0 - 36.0 g/dL   RDW 25.0 53.9 - 76.7 %   Platelets 317 150 - 400 K/uL   nRBC 0.0 0.0 - 0.2 %  hCG, quantitative, pregnancy     Status: Abnormal   Collection Time: 06/24/19  7:41 PM  Result Value Ref Range   hCG, Beta Chain, Quant, S 4,634 (H)  <5 mIU/mL  Comprehensive metabolic panel     Status: None   Collection Time: 06/24/19  8:58 PM  Result Value Ref Range   Sodium 136 135 - 145 mmol/L   Potassium 3.5 3.5 - 5.1 mmol/L   Chloride 102 98 - 111 mmol/L   CO2 24 22 - 32 mmol/L   Glucose, Bld 94 70 - 99 mg/dL   BUN 7 6 - 20 mg/dL   Creatinine, Ser 3.41 0.44 - 1.00 mg/dL   Calcium 9.3 8.9 - 93.7 mg/dL   Total Protein 7.4 6.5 - 8.1 g/dL   Albumin 4.5 3.5 - 5.0 g/dL   AST 18 15 - 41 U/L   ALT 17 0 - 44 U/L   Alkaline Phosphatase 49 38 - 126 U/L   Total Bilirubin 0.4 0.3 - 1.2 mg/dL   GFR calc non Af Amer >60 >60 mL/min   GFR calc Af Amer >60 >60 mL/min   Anion gap 10 5 - 15    US OB LESS THAN 14 WEEKS WITH OB TRANSVAGINAL  Result Date: 06/24/2019 CLINICAL DATA:  Left lower quadrant pain, history of right ectopic pregnancy status post salpingectomy, positive urine pregnancy test EXAM: OBSTETRIC <14 WK [place] AND TRANSVAGINAL OB US TECHNIQUE: Both transabdominal and transvaginal ultrasound examinations were performed for complete evaluation of the gestation as well as the maternal uterus, adnexal regions, and pelvic cul-de-sac. Transvaginal technique was performed to assess early pregnancy. COMPARISON:  None. FINDINGS: Intrauterine gestational sac: None Maternal uterus/adnexae: Uterus is anteverted. No evidence of intrauterine pregnancy. Left ovary measures 3.0 x 1.9 x 1.7 cm. Cystic structure within the left adnexa measures 11 x 6 x 6 mm, with apparent internal yolk sac, compatible with ectopic pregnancy. Based on mean sac diameter, this corresponds to an estimated age of 5 weeks and 4 days. Fetal pole is not yet visualized. No evidence of free-fluid or rupture. Gynecologic consultation is recommended. Right ovary measures 3.0 x 2.2 x 1.9 cm, and is unremarkable. IMPRESSION: 1. Left adnexal ectopic pregnancy as above, gynecologic consultation recommended. 2. Unremarkable uterus and right ovary. These results were called by telephone at the  time of interpretation on 06/24/2019 at 8:47 pm to provider [facility] , who verbally acknowledged these results. Electronically Signed   By: Sharlet Salina M.D.   On: 06/24/2019 20:47   MAU Course  Procedures  MDM Labs and [place] ordered and reviewed.  Transfer of care given to Korbyn Vanes, NP Laurynn Mccorvey, Odie Sera,  NP  06/24/2019 9:29 PM  -CBC: WNL -CMP: WNL -hCG: 0,102 -ABO: B Positive -[place]: left adnexal ectopic pregnancy -consulted with Dr. Macon Large, OK to proceed with methotrexate -The risks of methotrexate were reviewed including failure requiring repeat dosing or eventual surgery. She understands that methotrexate involves frequent return visits to monitor lab values and that she remains at risk of ectopic rupture until her beta is less than assay. ?The patient opts to proceed with methotrexate.  She has no history of hepatic or renal dysfunction, has normal BUN/Cr/LFT's/platelets.  She is felt to be reliable for follow-up. Side effects of photosensitivity & GI upset were discussed.  She knows to avoid direct sunlight and abstain from alcohol, NSAIDs and sexual intercourse for two weeks. She was counseled to discontinue any MVI with folic acid. ?She understands to follow up on D4 (Monday 06/27/2019) and D7 (Thursday 06/30/2019) for repeat BHCG and was given the instruction sheet. ?Strict ectopic precautions were reviewed, the patient knows to call with any abdominal pain, vomiting, fainting, or any concerns with her health.  Day 0/1 Day 4 Day 7  Sunday Wednesday Saturday  Monday Thursday Sunday  Tuesday Friday Monday  Wednesday Saturday Tuesday  Thursday Sunday Wednesday  Friday Monday Thursday  Saturday Tuesday Friday    Methotrexate Treatment Protocol for Ectopic Pregnancy  [x] Pretreatment testing and instructions  [x] hCG concentration (4,634)  [x] Transvaginal ultrasound (left ectopic - gestational sac with yolk sac)  [x] Blood group and Rh(D) typing (B Positive) [x] Complete blood count  (WNL)  [x] Liver and renal function tests (CMP WNL) [x] Discontinue folic acid supplements  [x] Counsel patient to avoid NSAIDs, recommend acetaminophen if an analgesic is needed  [x] Advise patient to refrain from sexual intercourse and strenuous exercise  Treatment day  Single dose protocol   1  hCG.  Administer Methotrexate 50 mg/m2 body surface area IM  4  hCG  7  hCG  If <15 percent hCG decline from day 4 to 7, give additional dose of methotrexate 50 mg/m2 IM  If ?15 percent hCG decline from day 4 to 7, draw hCG weekly until undetectable  14  hCG  If <15 percent hCG decline from day 7 to 14, give additional dose of methotrexate 50 mg/m2 IM  If ?15 percent hCG decline from day 7 to 14, check hCG weekly until undetectable  21 and 28  If 3 doses have been given and there is a <15 percent hCG decline from day 21 to 28, proceed with laparoscopic surgery  Laparoscopy  If severe abdominal pain or an acute abdomen suggestive of tubal rupture occurs If ultrasonography reveals greater than 300 mL pelvic or other intraperitoneal fluid  The hCG concentration usually declines to less than 15 mIU/mL by 35 days postinjection but may take as long as 109 days. If the hCG does not decline to zero, a new pregnancy should be excluded; if the hCG is rising, a transvaginal ultrasound should be performed. Alternatively, some patients have a slow clearance of serum hCG. If three weekly values are similar, consider an additional dose of MTX (50 mg/m2) not to exceed the recommended maximum of three total doses. This typically accelerates the decline of serum hCG. The risk of gestational trophoblastic disease is low. Folinic acid rescue is not required for women treated with the single-dose protocol, even if multiple doses are ultimately given.   Prepared with data from:   [facility]. Clinical practice. Ectopic pregnancy. Med 2009; 361:379  American College of Obstetricians and Gynecologists. ACOG Practice  Bulletin No. 94: Medical management of ectopic pregnancy. Obstet Gynecol 2008; 213:0865.  Assessment and Plan   1. Ectopic pregnancy of left ovary   2. Abdominal pain in pregnancy   3. Blood type, Rh positive    Allergies as of 06/24/2019      Reactions   Other    Tide-hives      Medication List    STOP taking these medications   PNV Prenatal Plus Multivitamin 27-1 MG Tabs      -message sent to [facility] to schedule for Day 4 and Day 7 stat hCG -pt to call [facility] at 8am Monday to schedule hCG lab -strict ectopic/bleeding/pain/return MAU precautions given -pt discharged to home in stable condition  Dammon Makarewicz, Odie Sera, NP  9:29 PM 06/24/2019

## 2019-06-24 NOTE — MAU Note (Signed)
PT SAYS ON 4-3- HAD CYCLE- , BLED NL FOR 5 DAYS  THEN HAD SPOTTING -HOME  UPT-  WAS POSITIVE.  SO WED ON 14TH -  WENT TO [facility] - DREW LABS- . TOLD 5-6 WEEKS .  HAS U/S [facility] ON 4-28.  TODAY WHILE  AT WORK - STARTED HURTING ON LEFT SIDE.  ( NO R TUBE - REMOVED 07-31-2018 ) . NO MEDS

## 2019-06-27 ENCOUNTER — Telehealth: Payer: Self-pay | Admitting: Adult Health

## 2019-06-27 DIAGNOSIS — O00202 Left ovarian pregnancy without intrauterine pregnancy: Secondary | ICD-10-CM

## 2019-06-27 NOTE — Telephone Encounter (Signed)
Could not be completed

## 2019-06-27 NOTE — Telephone Encounter (Signed)
Pt was seen at the er Friday and needing HCG drawn per mom. Please advise. [phone] or [phone]

## 2019-06-27 NOTE — Telephone Encounter (Signed)
Left message I called, if she calls back needs stat [facility] 4/20 and /4/23

## 2019-06-28 DIAGNOSIS — O00202 Left ovarian pregnancy without intrauterine pregnancy: Secondary | ICD-10-CM | POA: Diagnosis not present

## 2019-06-28 LAB — BETA HCG QUANT (REF LAB): hCG Quant: 5231 m[IU]/mL

## 2019-06-28 NOTE — Addendum Note (Signed)
Addended by: Cyril Mourning A on: 06/28/2019 08:44 AM   Modules accepted: Orders

## 2019-06-28 NOTE — Telephone Encounter (Signed)
Pt aware needs labs today, go now and Friday

## 2019-06-29 ENCOUNTER — Inpatient Hospital Stay (HOSPITAL_COMMUNITY): Payer: BC Managed Care – PPO

## 2019-06-29 ENCOUNTER — Inpatient Hospital Stay (HOSPITAL_COMMUNITY)
Admission: AD | Admit: 2019-06-29 | Discharge: 2019-06-29 | Disposition: A | Discharge status: Home / Self Care | Payer: BC Managed Care – PPO | Attending: Family Medicine | Admitting: Family Medicine

## 2019-06-29 ENCOUNTER — Encounter (HOSPITAL_COMMUNITY): Payer: Self-pay | Admitting: Family Medicine

## 2019-06-29 ENCOUNTER — Other Ambulatory Visit: Payer: Self-pay | Admitting: Adult Health

## 2019-06-29 ENCOUNTER — Other Ambulatory Visit: Payer: Self-pay

## 2019-06-29 ENCOUNTER — Telehealth: Payer: Self-pay | Admitting: *Deleted

## 2019-06-29 DIAGNOSIS — Z3A01 Less than 8 weeks gestation of pregnancy: Secondary | ICD-10-CM | POA: Insufficient documentation

## 2019-06-29 DIAGNOSIS — O009 Unspecified ectopic pregnancy without intrauterine pregnancy: Secondary | ICD-10-CM | POA: Diagnosis not present

## 2019-06-29 DIAGNOSIS — O00102 Left tubal pregnancy without intrauterine pregnancy: Secondary | ICD-10-CM | POA: Diagnosis not present

## 2019-06-29 DIAGNOSIS — R109 Unspecified abdominal pain: Secondary | ICD-10-CM

## 2019-06-29 DIAGNOSIS — Z87891 Personal history of nicotine dependence: Secondary | ICD-10-CM | POA: Insufficient documentation

## 2019-06-29 DIAGNOSIS — O26891 Other specified pregnancy related conditions, first trimester: Secondary | ICD-10-CM

## 2019-06-29 DIAGNOSIS — O26851 Spotting complicating pregnancy, first trimester: Secondary | ICD-10-CM | POA: Diagnosis not present

## 2019-06-29 DIAGNOSIS — O00202 Left ovarian pregnancy without intrauterine pregnancy: Secondary | ICD-10-CM

## 2019-06-29 LAB — COMPREHENSIVE METABOLIC PANEL
ALT: 18 U/L (ref 0–44)
AST: 16 U/L (ref 15–41)
Albumin: 4.5 g/dL (ref 3.5–5.0)
Alkaline Phosphatase: 54 U/L (ref 38–126)
Anion gap: 9 (ref 5–15)
BUN: 7 mg/dL (ref 6–20)
CO2: 26 mmol/L (ref 22–32)
Calcium: 9.8 mg/dL (ref 8.9–10.3)
Chloride: 103 mmol/L (ref 98–111)
Creatinine, Ser: 0.72 mg/dL (ref 0.44–1.00)
GFR calc Af Amer: >60 mL/min (ref >60)
GFR calc non Af Amer: >60 mL/min (ref >60)
Glucose, Bld: 92 mg/dL (ref 70–99)
Potassium: 3.7 mmol/L (ref 3.5–5.1)
Sodium: 138 mmol/L (ref 135–145)
Total Bilirubin: 0.5 mg/dL (ref 0.3–1.2)
Total Protein: 7.6 g/dL (ref 6.5–8.1)

## 2019-06-29 LAB — HEMOGLOBIN AND HEMATOCRIT, BLOOD
HCT: 38.3 % (ref 36.0–46.0)
Hemoglobin: 12.7 g/dL (ref 12.0–15.0)

## 2019-06-29 LAB — HCG, QUANTITATIVE, PREGNANCY: hCG, Beta Chain, Quant, S: 6784 m[IU]/mL — ABNORMAL HIGH (ref <5)

## 2019-06-29 MED ORDER — METHOTREXATE FOR ECTOPIC PREGNANCY
50.0000 mg/m2 | Freq: Once | INTRAMUSCULAR | Status: AC
  Administered 2019-06-29: 90 mg via INTRAMUSCULAR
  Filled 2019-06-29: qty 1

## 2019-06-29 NOTE — MAU Note (Signed)
Sent in for 2nd dose methotrexate. (hormone level has gone up)  Doing ok, having some pain in LLQ.  Light cramping, having light bleeding- has never gotten heavy.

## 2019-06-29 NOTE — Progress Notes (Signed)
Ck QHCG 4/23

## 2019-06-29 NOTE — MAU Provider Note (Signed)
Chief Complaint: Ectopic Pregnancy   First Provider Initiated Contact with Patient 06/29/19 1125     SUBJECTIVE HPI: Heidi Owens is a 34 y.o. G2P0020 at 66w5dwho presents to Maternity Admissions reporting abdominal pain.  Received methotrexate on 4/16 for left ectopic pregnancy. HCG on day 1 was 4634. Had day 4 HCG yesterday (which resulted today) & it was 5231. Patient reports some intermittent abdominal cramping in LLQ & was instructed to come her by her ob/gyn for workup & possible 2nd dose of MTX.  Her only other pregnancy was a right ectopic that resulted in right salpingectomy (07/2018).   Location: LLQ Quality: cramping Severity: 0 (currently denies pain)/10 on pain scale Duration: 2 days Timing: intermittent Modifying factors: none Associated signs and symptoms: vaginal spotting  Past Medical History:  Diagnosis Date  . Medical history non-contributory    OB History  Gravida Para Term Preterm AB Living  2 0 0 0 2 0  SAB TAB Ectopic Multiple Live Births  0 0 2 0 0    # Outcome Date GA Lbr Len/2nd Weight Sex Delivery Anes PTL Lv  2 Ectopic 06/2019          1 Ectopic 07/2018           Past Surgical History:  Procedure Laterality Date  . DIAGNOSTIC LAPAROSCOPY WITH REMOVAL OF ECTOPIC PREGNANCY Right 07/31/2018   Procedure: LAPAROSCOPY ECTOPIC REMOVAL;  Surgeon: EFlorian Buff MD;  [place]  Service: Gynecology;  Laterality: Right;   Social History   Socioeconomic History  . Marital status: Single    Spouse name: Not on file  . Number of children: Not on file  . Years of education: Not on file  . Highest education level: Not on file  Occupational History  . Not on file  Tobacco Use  . Smoking status: Former Smoker    Packs/day: 0.50    Types: Cigarettes    Quit date: 07/08/2017    Years since quitting: 1.9  . Smokeless tobacco: Never Used  Substance and Sexual Activity  . Alcohol use: Not Currently    Comment: occ  . Drug use: No  . Sexual activity:  Yes    Birth control/protection: None  Other Topics Concern  . Not on file  Social History Narrative  . Not on file   Social Determinants of Health   Financial Resource Strain:   . Difficulty of Paying Living Expenses:   Food Insecurity:   . Worried About RCharity fundraiserin the Last Year:   . RArboriculturistin the Last Year:   Transportation Needs:   . LFilm/video editor(Medical):   .Marland KitchenLack of Transportation (Non-Medical):   Physical Activity:   . Days of Exercise per Week:   . Minutes of Exercise per Session:   Stress:   . Feeling of Stress :   Social Connections:   . Frequency of Communication with Friends and Family:   . Frequency of Social Gatherings with Friends and Family:   . Attends Religious Services:   . Active Member of Clubs or Organizations:   . Attends CArchivistMeetings:   .Marland KitchenMarital Status:   Intimate Partner Violence:   . Fear of Current or Ex-Partner:   . Emotionally Abused:   .Marland KitchenPhysically Abused:   . Sexually Abused:    Family History  Problem Relation Age of Onset  . Hypertension Mother   . Healthy Father   . Cancer  Maternal Grandfather    No current facility-administered medications on file prior to encounter.   No current outpatient medications on file prior to encounter.   Allergies  Allergen Reactions  . Other     Tide-hives    I have reviewed patient's Past Medical Hx, Surgical Hx, Family Hx, Social Hx, medications and allergies.   Review of Systems  Constitutional: Negative.   Gastrointestinal: Positive for abdominal pain (none currently).  Genitourinary: Positive for vaginal bleeding (spotting).    OBJECTIVE Patient Vitals for the past 24 hrs:  BP Temp Temp src Pulse Resp SpO2 Height Weight  06/29/19 1130 123/68 98.5 F (36.9 C) Oral 63 18 100 % '5\' 5"'  (1.651 m) 70.9 kg   Constitutional: Well-developed, well-nourished female in no acute distress.  Cardiovascular: normal rate & rhythm, no  murmur Respiratory: normal rate and effort. Lung sounds clear throughout GI: Abd soft, non-tender, Pos BS x 4. No guarding or rebound tenderness MS: Extremities nontender, no edema, normal ROM Neurologic: Alert and oriented x 4.    LAB RESULTS Results for orders placed or performed during the hospital encounter of 06/29/19 (from the past 24 hour(s))  Hemoglobin and hematocrit, blood     Status: None   Collection Time: 06/29/19 11:45 AM  Result Value Ref Range   Hemoglobin 12.7 12.0 - 15.0 g/dL   HCT 38.3 36.0 - 46.0 %  hCG, quantitative, pregnancy     Status: Abnormal   Collection Time: 06/29/19 11:45 AM  Result Value Ref Range   hCG, Beta Chain, Quant, S 6,784 (H) <5 mIU/mL  Comprehensive metabolic panel     Status: None   Collection Time: 06/29/19 11:45 AM  Result Value Ref Range   Sodium 138 135 - 145 mmol/L   Potassium 3.7 3.5 - 5.1 mmol/L   Chloride 103 98 - 111 mmol/L   CO2 26 22 - 32 mmol/L   Glucose, Bld 92 70 - 99 mg/dL   BUN 7 6 - 20 mg/dL   Creatinine, Ser 0.72 0.44 - 1.00 mg/dL   Calcium 9.8 8.9 - 10.3 mg/dL   Total Protein 7.6 6.5 - 8.1 g/dL   Albumin 4.5 3.5 - 5.0 g/dL   AST 16 15 - 41 U/L   ALT 18 0 - 44 U/L   Alkaline Phosphatase 54 38 - 126 U/L   Total Bilirubin 0.5 0.3 - 1.2 mg/dL   GFR calc non Af Amer >60 >60 mL/min   GFR calc Af Amer >60 >60 mL/min   Anion gap 9 5 - 15    IMAGING US OB Transvaginal  Result Date: 06/29/2019 CLINICAL DATA:  No left ectopic pregnancy. Some pelvic pain and spotting. EXAM: TRANSVAGINAL OB ULTRASOUND TECHNIQUE: Transvaginal ultrasound was performed for complete evaluation of the gestation as well as the maternal uterus, adnexal regions, and pelvic cul-de-sac. COMPARISON:  Ob ultrasound, 06/24/2019. FINDINGS: Intrauterine gestational sac: None Left adnexal ectopic pregnancy: Left ectopic pregnancy with a well-formed yolk sac. The gestational sac along the left adnexa has slightly enlarged, measuring 9.98 mm, consistent with 5  week, 5 day gestation. No visualized embryo or cardiac activity. Overall size of the left adnexal mass/ectopic pregnancy is 2.3 x 1.5 x 1.5 cm. Maternal uterus/adnexae: Normal uterus. Normal ovaries. Normal right adnexa. Small amount of pelvic free fluid. IMPRESSION: 1. Left ectopic pregnancy with a well-formed gestational sac and yolk sac, but no visualized embryo. Mean sac diameter is 9.98 mm consistent with a 5 week, 5 day gestation, which is similar to the  prior ultrasound. 2. Small amount of pelvic free fluid is now noted, which may reflect minimal rupture or be physiologic. 3. No other abnormalities. Electronically Signed   By: Lajean Manes M.D.   On: 06/29/2019 12:34    MAU COURSE Orders Placed This Encounter  Procedures  . US OB Transvaginal  . Hemoglobin and hematocrit, blood  . hCG, quantitative, pregnancy  . Comprehensive metabolic panel  . Discharge patient   Meds ordered this encounter  Medications  . methotrexate [facility]) chemo injection kit 90 mg    MDM Vital signs stable.  Benign abdominal exam.  Currently denies pain  Reviewed labs & ultrasound with Dr. Kennon Rounds. Patient is stable. This is her 1 remaining tube which she would like to preserve for future fertility. Will give 2nd dose of methotrexate today with strict return precautions.   Reviewed results with patient & her spouse. She is interested in second dose of methotrexate. Will bring back to MAU for day 4 HCG.   ASSESSMENT 1. Ectopic pregnancy without intrauterine pregnancy, unspecified location   2. Abdominal pain during pregnancy in first trimester     PLAN Discharge home in stable condition. Strict ectopic return precautions Pt to return Saturday for day 4 HCG Msg to [facility] for day 7 HCG to be scheduled   Follow-up Information    [facility]. Go on 07/02/2019.   Specialty: Obstetrics and Gynecology Why: return Saturday for repeat blood work or sooner for worsening  symptoms Contact information: [address] [id number] [place] [phone]         Allergies as of 06/29/2019      Reactions   Other    Tide-hives      Medication List    You have not been prescribed any medications.      Jorje Guild, NP 06/29/2019  2:02 PM

## 2019-06-29 NOTE — MAU Note (Signed)
Pt not in family [facility]. Informed Lawrence NP.  She had talked to pt and explained plan- pt is to return on Sat to MAU.

## 2019-06-29 NOTE — Telephone Encounter (Signed)
Spoke with pt. Pt has an ectopic pregnancy. Pt's quant has increased and she is hurting bad. I spoke with JAG and she advised pt to go to MAU ASAP for possible 2nd Methotrexate injection. Pt voiced understanding. JSY

## 2019-06-29 NOTE — Discharge Instructions (Signed)
Return to care   If you have heavier bleeding that soaks through more that 2 pads per hour for an hour or more  If you bleed so much that you feel like you might pass out or you do pass out  If you have significant abdominal pain that is not improved with Tylenol       Methotrexate Treatment for an Ectopic Pregnancy, Care After This sheet gives you information about how to care for yourself after your procedure. Your health care provider may also give you more specific instructions. If you have problems or questions, contact your health care provider. What can I expect after the procedure? After the procedure, it is common to have:  Abdominal cramping.  Vaginal bleeding.  Fatigue.  Nausea.  Vomiting.  Diarrhea. Blood tests will be taken at timed intervals for several days or weeks to check your pregnancy hormone levels. The blood tests will be done until the pregnancy hormone can no longer be detected in the blood. Follow these instructions at home: Activity  Do not have sex until your health care provider approves.  Limit activities that take a lot of effort as told by your health care provider. Medicines  Take over the counter and prescription medicines only as told by your health care provider.  Do not take aspirin, ibuprofen, naproxen, or any other NSAIDs.  Do not take folic acid, prenatal vitamins, or other vitamins that contain folic acid. General instructions   Do not drink alcohol.  Follow instructions from your health care provider on how and when to report any symptoms that may indicate a ruptured ectopic pregnancy.  Keep all follow-up visits as told by your health care provider. This is important. Contact a health care provider if:  You have persistent nausea and vomiting.  You have persistent diarrhea.  You are having a reaction to the medicine, such as: ? Tiredness. ? Skin rash. ? Hair loss. Get help right away if:  Your abdominal or pelvic  pain gets worse.  You have more vaginal bleeding.  You feel light-headed or you faint.  You have shortness of breath.  Your heart rate increases.  You develop a cough.  You have chills.  You have a fever. Summary  After the procedure, it is common to have symptoms of abdominal cramping, vaginal bleeding and fatigue. You may also experience other symptoms.  Blood tests will be taken at timed intervals for several days or weeks to check your pregnancy hormone levels. The blood tests will be done until the pregnancy hormone can no longer be detected in the blood.  Limit strenuous activity as told by your health care provider.  Follow instructions from your health care provider on how and when to report any symptoms that may indicate a ruptured ectopic pregnancy. This information is not intended to replace advice given to you by your health care provider. Make sure you discuss any questions you have with your health care provider. Document Revised: 02/06/2017 Document Reviewed: 04/15/2016 Elsevier Patient Education  2020 [employer].

## 2019-07-02 ENCOUNTER — Other Ambulatory Visit: Payer: Self-pay

## 2019-07-02 ENCOUNTER — Inpatient Hospital Stay (HOSPITAL_COMMUNITY)
Admission: AD | Admit: 2019-07-02 | Discharge: 2019-07-02 | Disposition: A | Discharge status: Home / Self Care | Payer: BC Managed Care – PPO | Attending: Obstetrics and Gynecology | Admitting: Obstetrics and Gynecology

## 2019-07-02 DIAGNOSIS — O00102 Left tubal pregnancy without intrauterine pregnancy: Secondary | ICD-10-CM

## 2019-07-02 LAB — HCG, QUANTITATIVE, PREGNANCY: hCG, Beta Chain, Quant, S: 5629 m[IU]/mL — ABNORMAL HIGH (ref <5)

## 2019-07-02 NOTE — MAU Provider Note (Signed)
Ms. LINZEE DEPAUL  is a 34 y.o. G2P0020 who presents to MAU today for follow-up day 4 quant hCG after second dose MTX for left ectopic pregnancy. The patient was seen in MAU on 06/29/19 and had quant hCG of 6784 and US showed left ectopic with IUGS and YS. She denies pain or fever today. She is having light bleeding, changing 1-2 pads per day.  OB History  Gravida Para Term Preterm AB Living  2 0 0 0 2 0  SAB TAB Ectopic Multiple Live Births  0 0 2 0 0    # Outcome Date GA Lbr Len/2nd Weight Sex Delivery Anes PTL Lv  2 Ectopic 06/2019          1 Ectopic 07/2018            Past Medical History:  Diagnosis Date  . Medical history non-contributory    ROS: + VB no pain  BP (!) 104/58 (BP Location: Right Arm)   Pulse 78   Temp 98.2 F (36.8 C) (Oral)   Resp 16   LMP 06/11/2019   SpO2 100% Comment: room air  CONSTITUTIONAL: Well-developed, well-nourished female in no acute distress.  MUSCULOSKELETAL: Normal range of motion.  CARDIOVASCULAR: Regular heart rate RESPIRATORY: Normal effort NEUROLOGICAL: Alert and oriented to person, place, and time.  SKIN: Not diaphoretic. No erythema. No pallor. PSYCH: Normal mood and affect. Normal behavior. Normal judgment and thought content.  Results for orders placed or performed during the hospital encounter of 07/02/19 (from the past 24 hour(s))  hCG, quantitative, pregnancy     Status: Abnormal   Collection Time: 07/02/19 10:29 AM  Result Value Ref Range   hCG, Beta Chain, Quant, S 5,629 (H) <5 mIU/mL    MDM: Labs reviewed. Discussed presentation and results with Dr. Emelda Fear who discussed findings and plan with pt. Stable for discharge home.   A: 1. Left tubal pregnancy without intrauterine pregnancy     P: Discharge home Strict return precautions discussed Patient will return for follow-up quant at [facility] as scheduled 07/05/19 Patient may return to MAU as needed or if her condition were to change or worsen   Donette Larry,  [place] 07/02/2019 12:02 PM

## 2019-07-02 NOTE — MAU Note (Signed)
Heidi Owens is a 34 y.o. at [redacted]w[redacted]d here in MAU reporting: here for day 4 labs post 2nd dose of MTX. States she is bleeding, like a period. It is the same as previous visit. No pain.  Pain score: 0/10  Vitals:   07/02/19 1023  BP: (!) 104/58  Pulse: 78  Resp: 16  Temp: 98.2 F (36.8 C)  SpO2: 100%     Lab orders placed from triage: hcg order released

## 2019-07-05 ENCOUNTER — Other Ambulatory Visit: Payer: BC Managed Care – PPO

## 2019-07-06 ENCOUNTER — Other Ambulatory Visit: Payer: Self-pay

## 2019-07-06 ENCOUNTER — Other Ambulatory Visit: Payer: BC Managed Care – PPO

## 2019-07-06 DIAGNOSIS — O00102 Left tubal pregnancy without intrauterine pregnancy: Secondary | ICD-10-CM | POA: Diagnosis not present

## 2019-07-07 ENCOUNTER — Telehealth: Payer: Self-pay | Admitting: Obstetrics and Gynecology

## 2019-07-07 LAB — BETA HCG QUANT (REF LAB): hCG Quant: 2966 m[IU]/mL

## 2019-07-07 NOTE — Telephone Encounter (Signed)
Patient aware that Qhcg has declined to 2900, and is having no pain. Followup weekly planned.

## 2019-07-09 ENCOUNTER — Inpatient Hospital Stay (HOSPITAL_COMMUNITY): Payer: BC Managed Care – PPO

## 2019-07-09 ENCOUNTER — Other Ambulatory Visit: Payer: Self-pay

## 2019-07-09 ENCOUNTER — Inpatient Hospital Stay (HOSPITAL_COMMUNITY)
Admission: AD | Admit: 2019-07-09 | Discharge: 2019-07-10 | Disposition: A | Discharge status: Home / Self Care | Payer: BC Managed Care – PPO | Attending: Obstetrics and Gynecology | Admitting: Obstetrics and Gynecology

## 2019-07-09 ENCOUNTER — Inpatient Hospital Stay (HOSPITAL_COMMUNITY): Payer: BC Managed Care – PPO | Admitting: Certified Registered Nurse Anesthetist

## 2019-07-09 ENCOUNTER — Encounter (HOSPITAL_COMMUNITY): Payer: Self-pay | Admitting: Obstetrics and Gynecology

## 2019-07-09 ENCOUNTER — Encounter (HOSPITAL_COMMUNITY)
Admission: AD | Disposition: A | Discharge status: Home / Self Care | Payer: Self-pay | Source: Home / Self Care | Attending: Obstetrics and Gynecology

## 2019-07-09 DIAGNOSIS — Z9079 Acquired absence of other genital organ(s): Secondary | ICD-10-CM | POA: Diagnosis not present

## 2019-07-09 DIAGNOSIS — N736 Female pelvic peritoneal adhesions (postinfective): Secondary | ICD-10-CM | POA: Insufficient documentation

## 2019-07-09 DIAGNOSIS — O26891 Other specified pregnancy related conditions, first trimester: Secondary | ICD-10-CM

## 2019-07-09 DIAGNOSIS — O009 Unspecified ectopic pregnancy without intrauterine pregnancy: Secondary | ICD-10-CM

## 2019-07-09 DIAGNOSIS — Z87891 Personal history of nicotine dependence: Secondary | ICD-10-CM | POA: Diagnosis not present

## 2019-07-09 DIAGNOSIS — Z20822 Contact with and (suspected) exposure to covid-19: Secondary | ICD-10-CM | POA: Diagnosis not present

## 2019-07-09 DIAGNOSIS — R109 Unspecified abdominal pain: Secondary | ICD-10-CM | POA: Diagnosis not present

## 2019-07-09 DIAGNOSIS — O3480 Maternal care for other abnormalities of pelvic organs, unspecified trimester: Secondary | ICD-10-CM | POA: Diagnosis not present

## 2019-07-09 DIAGNOSIS — O00102 Left tubal pregnancy without intrauterine pregnancy: Secondary | ICD-10-CM | POA: Insufficient documentation

## 2019-07-09 HISTORY — PX: LAPAROSCOPIC SALPINGO OOPHERECTOMY: SHX5927

## 2019-07-09 LAB — CBC
HCT: 35.2 % — ABNORMAL LOW (ref 36.0–46.0)
Hemoglobin: 11.6 g/dL — ABNORMAL LOW (ref 12.0–15.0)
MCH: 32.3 pg (ref 26.0–34.0)
MCHC: 33 g/dL (ref 30.0–36.0)
MCV: 98.1 fL (ref 80.0–100.0)
Platelets: 280 10*3/uL (ref 150–400)
RBC: 3.59 MIL/uL — ABNORMAL LOW (ref 3.87–5.11)
RDW: 12.8 % (ref 11.5–15.5)
WBC: 11.3 10*3/uL — ABNORMAL HIGH (ref 4.0–10.5)
nRBC: 0 % (ref 0.0–0.2)

## 2019-07-09 LAB — HEMOGLOBIN AND HEMATOCRIT, BLOOD
HCT: 36.7 % (ref 36.0–46.0)
Hemoglobin: 12.2 g/dL (ref 12.0–15.0)

## 2019-07-09 LAB — RESPIRATORY PANEL BY RT PCR (FLU A&B, COVID)
Influenza A by PCR: NEGATIVE
Influenza B by PCR: NEGATIVE
SARS Coronavirus 2 by RT PCR: NEGATIVE

## 2019-07-09 LAB — HCG, QUANTITATIVE, PREGNANCY: hCG, Beta Chain, Quant, S: 2897 m[IU]/mL — ABNORMAL HIGH (ref <5)

## 2019-07-09 LAB — TYPE AND SCREEN
ABO/RH(D): B POS
Antibody Screen: NEGATIVE

## 2019-07-09 SURGERY — SALPINGO-OOPHORECTOMY, LAPAROSCOPIC
Anesthesia: General | Laterality: Left

## 2019-07-09 MED ORDER — SUGAMMADEX SODIUM 200 MG/2ML IV SOLN
INTRAVENOUS | Status: DC | PRN
Start: 2019-07-09 — End: 2019-07-09
  Administered 2019-07-09: 200 mg via INTRAVENOUS

## 2019-07-09 MED ORDER — HYDROCODONE-ACETAMINOPHEN 5-325 MG PO TABS: 1.0000 | ORAL_TABLET | ORAL | 0 refills | Status: DC | PRN

## 2019-07-09 MED ORDER — PROPOFOL 10 MG/ML IV BOLUS
INTRAVENOUS | Status: AC
  Filled 2019-07-09: qty 20

## 2019-07-09 MED ORDER — ROCURONIUM BROMIDE 10 MG/ML (PF) SYRINGE
PREFILLED_SYRINGE | INTRAVENOUS | Status: DC | PRN
  Administered 2019-07-09: 45 mg via INTRAVENOUS

## 2019-07-09 MED ORDER — LIDOCAINE 2% (20 MG/ML) 5 ML SYRINGE
INTRAMUSCULAR | Status: DC | PRN
  Administered 2019-07-09: 90 mg via INTRAVENOUS

## 2019-07-09 MED ORDER — ONDANSETRON HCL 4 MG/2ML IJ SOLN
INTRAMUSCULAR | Status: DC | PRN
  Administered 2019-07-09: 4 mg via INTRAVENOUS

## 2019-07-09 MED ORDER — PROPOFOL 10 MG/ML IV BOLUS
INTRAVENOUS | Status: DC | PRN
  Administered 2019-07-09: 170 mg via INTRAVENOUS

## 2019-07-09 MED ORDER — BUPIVACAINE HCL (PF) 0.25 % IJ SOLN
INTRAMUSCULAR | Status: DC | PRN
  Administered 2019-07-09: 30 mL

## 2019-07-09 MED ORDER — LACTATED RINGERS IV SOLN: INTRAVENOUS | Status: DC | PRN

## 2019-07-09 MED ORDER — ARTIFICIAL TEARS OPHTHALMIC OINT
TOPICAL_OINTMENT | OPHTHALMIC | Status: DC | PRN
  Administered 2019-07-09: 1 via OPHTHALMIC

## 2019-07-09 MED ORDER — SODIUM CHLORIDE 0.9 % IR SOLN
Status: DC | PRN
  Administered 2019-07-09: 3000 mL

## 2019-07-09 MED ORDER — SODIUM CHLORIDE 0.9 % IV SOLN: 250.0000 mL | INTRAVENOUS | Status: DC | PRN

## 2019-07-09 MED ORDER — SODIUM CHLORIDE 0.9% FLUSH: 3.0000 mL | INTRAVENOUS | Status: DC | PRN

## 2019-07-09 MED ORDER — FENTANYL CITRATE (PF) 100 MCG/2ML IJ SOLN
INTRAMUSCULAR | Status: DC | PRN
  Administered 2019-07-09 (×4): 50 ug via INTRAVENOUS

## 2019-07-09 MED ORDER — FENTANYL CITRATE (PF) 100 MCG/2ML IJ SOLN: 25.0000 ug | INTRAMUSCULAR | Status: DC | PRN

## 2019-07-09 MED ORDER — IBUPROFEN 600 MG PO TABS: 600.0000 mg | ORAL_TABLET | Freq: Four times a day (QID) | ORAL | 1 refills | Status: DC | PRN

## 2019-07-09 MED ORDER — DEXAMETHASONE SODIUM PHOSPHATE 10 MG/ML IJ SOLN
INTRAMUSCULAR | Status: DC | PRN
  Administered 2019-07-09: 5 mg via INTRAVENOUS

## 2019-07-09 MED ORDER — MIDAZOLAM HCL 2 MG/2ML IJ SOLN
INTRAMUSCULAR | Status: AC
  Filled 2019-07-09: qty 2

## 2019-07-09 MED ORDER — FENTANYL CITRATE (PF) 250 MCG/5ML IJ SOLN
INTRAMUSCULAR | Status: AC
  Filled 2019-07-09: qty 5

## 2019-07-09 MED ORDER — MIDAZOLAM HCL 5 MG/5ML IJ SOLN
INTRAMUSCULAR | Status: DC | PRN
  Administered 2019-07-09: 2 mg via INTRAVENOUS

## 2019-07-09 MED ORDER — BUPIVACAINE HCL (PF) 0.5 % IJ SOLN
INTRAMUSCULAR | Status: AC
  Filled 2019-07-09: qty 30

## 2019-07-09 MED ORDER — SODIUM CHLORIDE 0.9% FLUSH: 3.0000 mL | Freq: Two times a day (BID) | INTRAVENOUS | Status: DC

## 2019-07-09 MED ORDER — SUCCINYLCHOLINE CHLORIDE 20 MG/ML IJ SOLN
INTRAMUSCULAR | Status: DC | PRN
  Administered 2019-07-09: 120 mg via INTRAVENOUS

## 2019-07-09 SURGICAL SUPPLY — 31 items
BAG SPEC RTRVL LRG 6X4 10 (ENDOMECHANICALS) ×1
DRSG OPSITE POSTOP 3X4 (GAUZE/BANDAGES/DRESSINGS) ×2 IMPLANT
DURAPREP 26ML APPLICATOR (WOUND CARE) ×3 IMPLANT
ELECT PAD GROUND ADT 9 (MISCELLANEOUS) ×2 IMPLANT
GLOVE BIO SURGEON STRL SZ 6 (GLOVE) ×3 IMPLANT
GLOVE BIOGEL PI IND STRL 6 (GLOVE) ×1 IMPLANT
GLOVE BIOGEL PI IND STRL 7.0 (GLOVE) ×1 IMPLANT
GLOVE BIOGEL PI INDICATOR 6 (GLOVE) ×2
GLOVE BIOGEL PI INDICATOR 7.0 (GLOVE) ×2
GLOVE ECLIPSE 7.0 STRL STRAW (GLOVE) ×3 IMPLANT
GOWN STRL REUS W/ TWL LRG LVL3 (GOWN DISPOSABLE) ×2 IMPLANT
GOWN STRL REUS W/TWL LRG LVL3 (GOWN DISPOSABLE) ×6
KIT TURNOVER KIT B (KITS) ×3 IMPLANT
LIGASURE VESSEL 5MM BLUNT TIP (ELECTROSURGICAL) IMPLANT
PACK LAPAROSCOPY BASIN (CUSTOM PROCEDURE TRAY) ×3 IMPLANT
PACK TRENDGUARD 450 HYBRID PRO (MISCELLANEOUS) IMPLANT
PENCIL BUTTON HOLSTER BLD 10FT (ELECTRODE) ×2 IMPLANT
POUCH SPECIMEN RETRIEVAL 10MM (ENDOMECHANICALS) ×2 IMPLANT
PROTECTOR NERVE ULNAR (MISCELLANEOUS) ×6 IMPLANT
SET IRRIG TUBING LAPAROSCOPIC (IRRIGATION / IRRIGATOR) ×2 IMPLANT
SET TUBE SMOKE EVAC HIGH FLOW (TUBING) ×3 IMPLANT
SHEARS HARMONIC ACE PLUS 36CM (ENDOMECHANICALS) ×2 IMPLANT
SLEEVE ENDOPATH XCEL 5M (ENDOMECHANICALS) ×3 IMPLANT
SUT MNCRL AB 4-0 PS2 18 (SUTURE) ×3 IMPLANT
SUT VICRYL 0 UR6 27IN ABS (SUTURE) ×6 IMPLANT
TOWEL GREEN STERILE FF (TOWEL DISPOSABLE) ×6 IMPLANT
TRAY FOLEY W/BAG SLVR 14FR (SET/KITS/TRAYS/PACK) ×3 IMPLANT
TRENDGUARD 450 HYBRID PRO PACK (MISCELLANEOUS) ×3
TROCAR BALLN 12MMX100 BLUNT (TROCAR) ×3 IMPLANT
TROCAR XCEL NON-BLD 5MMX100MML (ENDOMECHANICALS) ×3 IMPLANT
WARMER LAPAROSCOPE (MISCELLANEOUS) ×3 IMPLANT

## 2019-07-09 NOTE — Op Note (Signed)
Procedure(s): LAPAROSCOPIC SALPINGECTOMY WITH REMOVAL OF ECTOPIC PREGNANCY Procedure Note  Heidi Owens female 34 y.o. 07/09/2019  Procedure(s) and Anesthesia Type:    * LAPAROSCOPIC SALPINGECTOMY WITH REMOVAL OF ECTOPIC PREGNANCY - General  Surgeon(s) and Role:    Debbrah Alar, MD - Primary   Indications: The patient was admitted to the hospital with a ruptured ectopic pregnancy.  She had a known diagnosis of left sided ectopic pregnancy and had been treated with methotrexate.  Her HCG had been falling appropriately, then 2 days ago she began to have severe abdominal pain and imaging showed a significantly dilated left fallopian tube and free fluid in the pelvis.  Presentation was consistent with rupture of the left ectopic pregnancy and patient was consented for surgical management.  Discussed with patient risks of ruptured ectopic pregnancy vs risks of surgery including bleeding, infection, injury to structures, conversion to open, unexpected findings or treatment required.  Discussed that given her imaging findings, I do not anticipate being able to saver her tube, and as she had a previous salpingectomy for an ectopic on the right, this will make her unable to spontaneously become pregnant and she would require IVF if she were to desire to become pregnant in the future.     Surgeon: Debbrah Alar   Assistants: N/A  Anesthesia: General endotracheal anesthesia  ASA Class: 2    Procedure Detail  LAPAROSCOPIC SALPINGECTOMY WITH REMOVAL OF ECTOPIC PREGNANCY  Findings: Significantly dilated left fallopian tube with adhesions to left ovary and drainage of blood.  Moderate blood in pelvis.  Normal right ovary.  Surgically absent right fallopian tube.  Fitz-Hugh-Curtis adhesions over liver.  Excellent hemostasis at end of case.  Normal appearing uterus.  Filmy adhesions in posterior cul de sac.  Estimated Blood Loss:  50cc         Drains: Foley - out at end of case  100cc clear  urine         Total IV Fluids: 1000 ml  Blood Given: none          Specimens: Left fallopian tube with ectopic         Implants: none        Complications:  None         Disposition: PACU - hemodynamically stable.         Condition: stable   Patient was taken to the OR where general anesthesia was induced.  A time out was performed.  SCDs were on and operating.  A foley was placed in sterile conditions.  The patient was positioned in dorsal lithotomy position in [place] with care to avoid hyperflexion or extension of the lower extremities and left arm was tucked and padded.  She was prepped and draped in the normal sterile fashion.  A sponge stick was placed in the vagina.  A Hassan incision was made in a vertical fashion immediately inferior to the umbilicus with the scalpel.  The fascia was visualized and grasped with Kochers x 2 then entered sharply and the incision was extended to the full length of the skin access with Mayo scissors.  The peritoneum was grasped with hemostats x 2 and elevated and entered sharply in a clear area. The [place] port was then placed.   36mm ports were placed in the left lower quadrant and left mid quadrant under direct visualization.  Survey was performed with the above findings.  Suction irrigator used to evacuate blood from pelvis.  Dilated fallopian tube was separated  from left ovary using blunt dissection and harmonic.  Tube was separated from the mesosalpinx and cornual attachments.  Pedicles were inspected and dry.  Endocatch bag was inserted through the umbilicus and specimen removed.  Pelvis was again irrigated and inspected and noted to be dry.  Patient was flattened and as much hemoperitoneum as possible evacuated via suction.  Ports were removed under direct visualization.  Fascia of umbilicus was closed with 0-vicryl.  30cc of 0.5% marcaine was infiltrated into port sites.  Skin was closed with 4-0 vicryl.  Foley and sponge stick were  removed.  Final count was reported as correct and patient was stable for transfer to the recovery room.  No complications noted. Patient tolerated procedure well.

## 2019-07-09 NOTE — Discharge Instructions (Signed)
Diagnostic Laparoscopy, Care After This sheet gives you information about how to care for yourself after your procedure. Your health care provider may also give you more specific instructions. If you have problems or questions, contact your health care provider. What can I expect after the procedure? After the procedure, it is common to have:  Mild discomfort in the abdomen.  Sore throat. Women who have laparoscopy with pelvic examination may have mild cramping and fluid coming from the vagina for a few days after the procedure. Follow these instructions at home: Medicines  Take over-the-counter and prescription medicines only as told by your health care provider.  If you were prescribed an antibiotic medicine, take it as told by your health care provider. Do not stop taking the antibiotic even if you start to feel better. Driving  Do not drive for 24 hours if you were given a medicine to help you relax (sedative) during your procedure.  Do not drive or use heavy machinery while taking prescription pain medicine. Bathing  Do not take baths, swim, or use a hot tub until your health care provider approves. You may take showers. Incision care   Follow instructions from your health care provider about how to take care of your incisions. Make sure you: ? Wash your hands with soap and water before you change your bandage (dressing). If soap and water are not available, use hand sanitizer. ? Change your dressing as told by your health care provider. ? Leave stitches (sutures), skin glue, or adhesive strips in place. These skin closures may need to stay in place for 2 weeks or longer. If adhesive strip edges start to loosen and curl up, you may trim the loose edges. Do not remove adhesive strips completely unless your health care provider tells you to do that.  Check your incision areas every day for signs of infection. Check for: ? Redness, swelling, or pain. ? Fluid or  blood. ? Warmth. ? Pus or a bad smell. Activity  Return to your normal activities as told by your health care provider. Ask your health care provider what activities are safe for you.  Do not lift anything that is heavier than 10 lb (4.5 kg), or the limit that you are told, until your health care provider says that it is safe. General instructions  To prevent or treat constipation while you are taking prescription pain medicine, your health care provider may recommend that you: ? Drink enough fluid to keep your urine pale yellow. ? Take over-the-counter or prescription medicines. ? Eat foods that are high in fiber, such as fresh fruits and vegetables, whole grains, and beans. ? Limit foods that are high in fat and processed sugars, such as fried and sweet foods.  Do not use any products that contain nicotine or tobacco, such as cigarettes and e-cigarettes. If you need help quitting, ask your health care provider.  Keep all follow-up visits as told by your health care provider. This is important. Contact a health care provider if:  You develop shoulder pain.  You feel lightheaded or faint.  You are unable to pass gas or have a bowel movement.  You feel nauseous or you vomit.  You develop a rash.  You have redness, swelling, or pain around any incision.  You have fluid or blood coming from any incision.  Any incision feels warm to the touch.  You have pus or a bad smell coming from any incision.  You have a fever or chills. Get help   right away if:  You have severe pain.  You have vomiting that does not go away.  You have heavy bleeding from the vagina.  Any incision opens.  You have trouble breathing.  You have chest pain. Summary  After the procedure, it is common to have mild discomfort in the abdomen and a sore throat.  Check your incision areas every day for signs of infection.  Return to your normal activities as told by your health care provider. Ask  your health care provider what activities are safe for you. This information is not intended to replace advice given to you by your health care provider. Make sure you discuss any questions you have with your health care provider. Document Revised: 02/06/2017 Document Reviewed: 08/20/2016 Elsevier Patient Education  2020 Elsevier Inc.  

## 2019-07-09 NOTE — Anesthesia Preprocedure Evaluation (Addendum)
Anesthesia Evaluation  Patient identified by MRN, date of birth, ID band Patient awake    Reviewed: Allergy & Precautions, NPO status , Patient's Chart, lab work & pertinent test results  Airway Mallampati: II  TM Distance: >3 FB     Dental  (+) Dental Advisory Given, Teeth Intact   Pulmonary former smoker,    breath sounds clear to auscultation       Cardiovascular negative cardio ROS   Rhythm:Regular Rate:Normal     Neuro/Psych    GI/Hepatic negative GI ROS, Neg liver ROS,   Endo/Other  negative endocrine ROS  Renal/GU negative Renal ROS     Musculoskeletal   Abdominal   Peds  Hematology   Anesthesia Other Findings   Reproductive/Obstetrics                            Anesthesia Physical Anesthesia Plan  ASA: II  Anesthesia Plan: General   Post-op Pain Management:    Induction: Intravenous  PONV Risk Score and Plan: 3 and Ondansetron, Dexamethasone and Midazolam  Airway Management Planned: Oral ETT  Additional Equipment:   Intra-op Plan:   Post-operative Plan: Extubation in OR  Informed Consent: I have reviewed the patients History and Physical, chart, labs and discussed the procedure including the risks, benefits and alternatives for the proposed anesthesia with the patient or authorized representative who has indicated his/her understanding and acceptance.     Dental advisory given  Plan Discussed with: CRNA and Anesthesiologist  Anesthesia Plan Comments:         Anesthesia Quick Evaluation

## 2019-07-09 NOTE — Transfer of Care (Signed)
Immediate Anesthesia Transfer of Care Note  Patient: Heidi Owens  Procedure(s) Performed: LAPAROSCOPIC SALPINGECTOMY WITH REMOVAL OF ECTOPIC PREGNANCY (Left )  Patient Location: PACU  Anesthesia Type:General  Level of Consciousness: awake and alert   Airway & Oxygen Therapy: Patient Spontanous Breathing and Patient connected to nasal cannula oxygen  Post-op Assessment: Report given to RN, Post -op Vital signs reviewed and stable and Patient moving all extremities  Post vital signs: Reviewed and stable  Last Vitals:  Vitals Value Taken Time  BP 124/60 07/09/19 2233  Temp 36.7 C 07/09/19 2230  Pulse 83 07/09/19 2237  Resp 11 07/09/19 2237  SpO2 100 % 07/09/19 2237  Vitals shown include unvalidated device data.  Last Pain:  Vitals:   07/09/19 2230  TempSrc:   PainSc: 0-No pain         Complications: Dental injury.  Discussed with patient in PACU.  Patient says she had a known cavity in that tooth.  Provided emotional support, patient stated "its ok"

## 2019-07-09 NOTE — MAU Provider Note (Signed)
Chief Complaint: Abdominal Pain   First Provider Initiated Contact with Patient 07/09/19 1657     SUBJECTIVE HPI: Heidi Owens is a 34 y.o. G2P0020 at [redacted]w[redacted]d who presents to [facility] reporting abdominal pain. She was treated for ectopic pregnancy & given second dose of methotrexate on 4/21. Her last HCG dropped appropriately & was supposed to f/u with weekly HCGs. Presents today with worsening abdominal pain that started yesterday. Reports pain in LLQ. Doesn't radiate and nothing makes worse. Has continued to have some bleeding but not saturating pads.  She had a right salpingectomy last year for an ectopic pregnancy & imaging on 4/21 showed a left ectopic.   Location: abdomen Quality: cramping Severity: 8/10 on pain scale Duration: 2 days Timing: intermittent Modifying factors: none Associated signs and symptoms: none  Past Medical History:  Diagnosis Date  . Medical history non-contributory    OB History  Gravida Para Term Preterm AB Living  2 0 0 0 2 0  SAB TAB Ectopic Multiple Live Births  0 0 2 0 0    # Outcome Date GA Lbr Len/2nd Weight Sex Delivery Anes PTL Lv  2 Ectopic 06/2019          1 Ectopic 07/2018           Past Surgical History:  Procedure Laterality Date  . DIAGNOSTIC LAPAROSCOPY WITH REMOVAL OF ECTOPIC PREGNANCY Right 07/31/2018   Procedure: LAPAROSCOPY ECTOPIC REMOVAL;  Surgeon: Lazaro Arms, MD;  Location: [facility] OR;  Service: Gynecology;  Laterality: Right;   Social History   Socioeconomic History  . Marital status: Single    Spouse name: Not on file  . Number of children: Not on file  . Years of education: Not on file  . Highest education level: Not on file  Occupational History  . Not on file  Tobacco Use  . Smoking status: Former Smoker    Packs/day: 0.50    Types: Cigarettes    Quit date: 07/08/2017    Years since quitting: 2.0  . Smokeless tobacco: Never Used  Substance and Sexual Activity  . Alcohol use: Not Currently    Comment:  occ  . Drug use: No  . Sexual activity: Yes    Birth control/protection: None  Other Topics Concern  . Not on file  Social History Narrative  . Not on file   Social Determinants of Health   Financial Resource Strain:   . Difficulty of Paying Living Expenses:   Food Insecurity:   . Worried About Programme researcher, broadcasting/film/video in the Last Year:   . Barista in the Last Year:   Transportation Needs:   . Freight forwarder (Medical):   Marland Kitchen Lack of Transportation (Non-Medical):   Physical Activity:   . Days of Exercise per Week:   . Minutes of Exercise per Session:   Stress:   . Feeling of Stress :   Social Connections:   . Frequency of Communication with Friends and Family:   . Frequency of Social Gatherings with Friends and Family:   . Attends Religious Services:   . Active Member of Clubs or Organizations:   . Attends Banker Meetings:   Marland Kitchen Marital Status:   Intimate Partner Violence:   . Fear of Current or Ex-Partner:   . Emotionally Abused:   Marland Kitchen Physically Abused:   . Sexually Abused:    Family History  Problem Relation Age of Onset  . Hypertension Mother   . Healthy  Father   . Cancer Maternal Grandfather    No current facility-administered medications on file prior to encounter.   No current outpatient medications on file prior to encounter.   Allergies  Allergen Reactions  . Other     Tide-hives    I have reviewed patient's Past Medical Hx, Surgical Hx, Family Hx, Social Hx, medications and allergies.   Review of Systems  Constitutional: Negative.   Gastrointestinal: Positive for abdominal pain. Negative for constipation, diarrhea, nausea and vomiting.  Genitourinary: Negative.     OBJECTIVE Patient Vitals for the past 24 hrs:  BP Temp Temp src Pulse Resp SpO2 Height Weight  07/09/19 1625 115/82 98.3 F (36.8 C) Oral 71 16 100 % -- --  07/09/19 1622 -- -- -- -- -- -- 5\' 5"  (1.651 m) 69.4 kg   Constitutional: Well-developed, well-nourished  female in no acute distress.  Cardiovascular: normal rate & rhythm, no murmur Respiratory: normal rate and effort. Lung sounds clear throughout GI:TTP in LLQ. Soft. Not distended. No rebound. No guarding MS: Extremities nontender, no edema, normal ROM Neurologic: Alert and oriented x 4.      LAB RESULTS Results for orders placed or performed during the hospital encounter of 07/09/19 (from the past 24 hour(s))  Hemoglobin and hematocrit, blood     Status: None   Collection Time: 07/09/19  5:12 PM  Result Value Ref Range   Hemoglobin 12.2 12.0 - 15.0 g/dL   HCT 09/08/19 97.3 - 53.2 %  hCG, quantitative, pregnancy     Status: Abnormal   Collection Time: 07/09/19  5:12 PM  Result Value Ref Range   hCG, Beta Chain, Quant, S 2,897 (H) <5 mIU/mL    IMAGING 03-26-1991 OB Transvaginal  Result Date: 07/09/2019 CLINICAL DATA:  Pregnant patient with known left adnexal ectopic. Abdominal pain. EXAM: TRANSVAGINAL OB ULTRASOUND TECHNIQUE: Transvaginal ultrasound was performed for complete evaluation of the gestation as well as the maternal uterus, adnexal regions, and pelvic cul-de-sac. COMPARISON:  Obstetric ultrasound 06/29/2019 FINDINGS: Intrauterine gestational sac: No intrauterine gestational sac. Left adnexal ectopic pregnancy: Known left ectopic pregnancy with a well-formed gestational sac. Possible yolk sac in this gestational sac. There is an embryo but no fetal heart tones. MSD: 9.92 mm, unchanged.  CRL: 1.64 mm Maternal uterus/adnexae: No intrauterine gestational sac. The endometrium is thin. No fluid in the endometrial canal. Known left adnexal ectopic pregnancy with a gestational sac. The previous yolk sac is not definitively seen, however there is now a small fetal pole. The left adnexal mass measures 8 x 3 x 3.4 cm, with a gestational sac measuring 1.6 x 0.8 x 0.5 cm. There is small to moderate amount of free fluid in the cul-de-sac and left adnexa, with minimal low level internal echoes. The right ovary  is visualized and normal. IMPRESSION: Known left ectopic pregnancy. The gestational sac now contains a fetal pole, but no cardiac activity. The overall size of the adnexal mass has increased from prior exam. There is a small to moderate amount of free fluid in the cul-de-sac and left adnexa, also increased. These results were called by telephone at the time of interpretation on 07/09/2019 at 6:40 pm to provider 09/08/2019 , who verbally acknowledged these results. Electronically Signed   By: Judeth Horn M.D.   On: 07/09/2019 18:41    MAU COURSE Orders Placed This Encounter  Procedures  . Respiratory Panel by RT PCR (Flu A&B, Covid) - Nasopharyngeal Swab  . 09/08/2019 OB Transvaginal  . Hemoglobin and  hematocrit, blood  . hCG, quantitative, pregnancy  . Diet NPO time specified  . Type and screen  . Saline lock IV   No orders of the defined types were placed in this encounter.   MDM Vital signs stable. Abdomen soft, no guarding or distension. TTP in LLQ.   Labs & ultrasound ordered  Ultrasound shows left adnexal mass, IUGS now with fetal pole but no cardiac activity. Mass measuring up to 8 cm & complex fluid  Informed Dr. Vevelyn Francois of results, will come speak with patient regarding plan for treatment    Jorje Guild, NP 07/09/2019  7:22 PM

## 2019-07-09 NOTE — MAU Note (Signed)
Heidi Owens is a 34 y.o. at [redacted]w[redacted]d here in MAU reporting: states she had been okay after 2 dose of MTX. Then all of the sudden 2 days ago she started having abdominal pain. Still bleeding, changed a pad twice today.  Onset of complaint: 2 days  Pain score: 8/10  Vitals:   07/09/19 1625  BP: 115/82  Pulse: 71  Resp: 16  Temp: 98.3 F (36.8 C)  SpO2: 100%     Lab orders placed from triage: none

## 2019-07-09 NOTE — Anesthesia Procedure Notes (Signed)
Procedure Name: Intubation Date/Time: 07/09/2019 9:11 PM Performed by: Edmonia Caprio, CRNA Pre-anesthesia Checklist: Patient identified, Emergency Drugs available, Suction available, Patient being monitored and Timeout performed Patient Re-evaluated:Patient Re-evaluated prior to induction Oxygen Delivery Method: Circle system utilized Preoxygenation: Pre-oxygenation with 100% oxygen Induction Type: IV induction, Rapid sequence and Cricoid Pressure applied Laryngoscope Size: Miller and 2 Grade View: Grade I Tube type: Oral Tube size: 7.0 mm Number of attempts: 1 Airway Equipment and Method: Stylet Placement Confirmation: ETT inserted through vocal cords under direct vision,  positive ETCO2 and breath sounds checked- equal and bilateral Secured at: 23 cm Tube secured with: Tape Dental Injury: Dental damage  Comments: Patient induced.  Mouth scissored open with my fingers.  Right upper tooth broke in half and came out of mouth from pressure of my pointer finger.  Tooth recovered and DL x1 with Miller 2, grade 1 view.  Interior of broken tooth dark brown.

## 2019-07-10 ENCOUNTER — Other Ambulatory Visit: Payer: Self-pay | Admitting: Family Medicine

## 2019-07-10 DIAGNOSIS — O26891 Other specified pregnancy related conditions, first trimester: Secondary | ICD-10-CM

## 2019-07-10 DIAGNOSIS — O00202 Left ovarian pregnancy without intrauterine pregnancy: Secondary | ICD-10-CM

## 2019-07-10 DIAGNOSIS — R109 Unspecified abdominal pain: Secondary | ICD-10-CM

## 2019-07-10 MED ORDER — HYDROCODONE-ACETAMINOPHEN 5-325 MG PO TABS: 2.0000 | ORAL_TABLET | ORAL | 0 refills | Status: AC | PRN

## 2019-07-10 NOTE — Progress Notes (Signed)
Patient notified RN staff on L&D that prescription was sent to a pharmacy which does not currently have medication. Patient had a surgery for ectopic overnight and was sent Norco.  Requested medication be sent to another pharmacy which was done. [facility] called to cancel initial prescription.  Jerilynn Birkenhead, MD [facility] Family Medicine Fellow, [facility] [employer], [facility]

## 2019-07-10 NOTE — Anesthesia Postprocedure Evaluation (Signed)
Anesthesia Post Note  Patient: Heidi Owens  Procedure(s) Performed: LAPAROSCOPIC SALPINGECTOMY WITH REMOVAL OF ECTOPIC PREGNANCY (Left )     Patient location during evaluation: PACU Anesthesia Type: General Level of consciousness: awake Pain management: pain level controlled Vital Signs Assessment: post-procedure vital signs reviewed and stable Respiratory status: spontaneous breathing Cardiovascular status: stable Postop Assessment: no apparent nausea or vomiting Anesthetic complications: no    Last Vitals:  Vitals:   07/09/19 2248 07/09/19 2303  BP: 124/84 121/78  Pulse: 77 65  Resp: 14   Temp:    SpO2: 100% 100%    Last Pain:  Vitals:   07/09/19 2330  TempSrc:   PainSc: 0-No pain                 Esli Jernigan

## 2019-07-12 LAB — SURGICAL PATHOLOGY

## 2019-08-09 ENCOUNTER — Encounter: Payer: BC Managed Care – PPO | Admitting: Obstetrics & Gynecology

## 2020-02-13 DIAGNOSIS — M7740 Metatarsalgia, unspecified foot: Secondary | ICD-10-CM | POA: Diagnosis not present

## 2020-02-13 DIAGNOSIS — M2141 Flat foot [pes planus] (acquired), right foot: Secondary | ICD-10-CM | POA: Diagnosis not present

## 2020-02-13 DIAGNOSIS — M79671 Pain in right foot: Secondary | ICD-10-CM | POA: Diagnosis not present

## 2020-02-13 DIAGNOSIS — L851 Acquired keratosis [keratoderma] palmaris et plantaris: Secondary | ICD-10-CM | POA: Diagnosis not present

## 2020-08-08 IMAGING — US OBSTETRIC <14 WK ULTRASOUND
1 series · 13 of 28 positions shown · non-contrast
Comparison: None.

CLINICAL DATA: Right-sided pelvic pain and vaginal bleeding

EXAM:
OBSTETRIC <14 WK US AND TRANSVAGINAL OB US
TECHNIQUE: Both transabdominal and transvaginal ultrasound examinations were
performed for complete evaluation of the gestation as well as the
maternal uterus, adnexal regions, and pelvic cul-de-sac.
Transvaginal technique was performed to assess early pregnancy.

[Series 1: obstetric <14 wk ultrasound · 164 acquisitions, 13 frames shown]
[im 7/164]
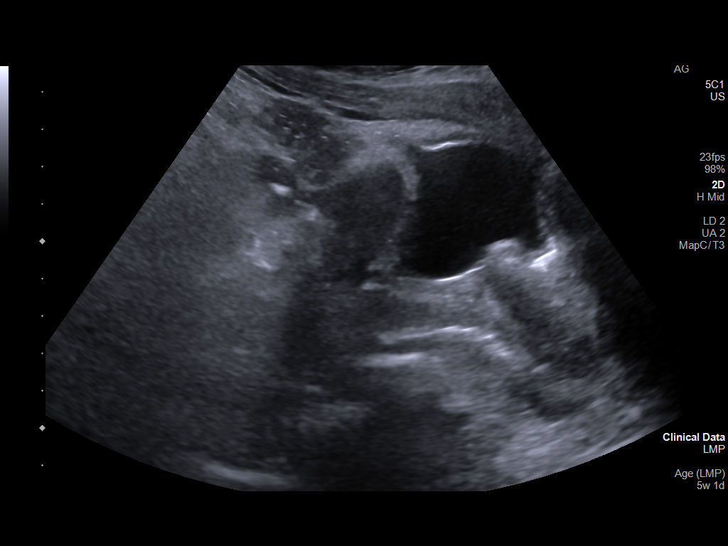
[im 19/164]
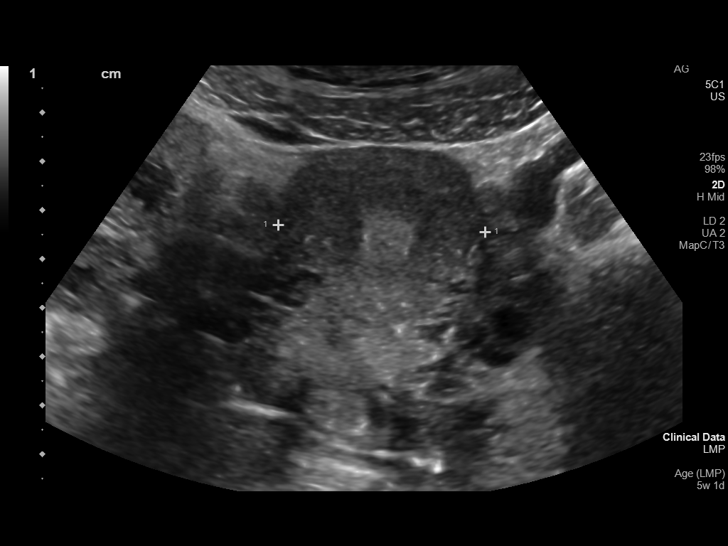
[im 31/164]
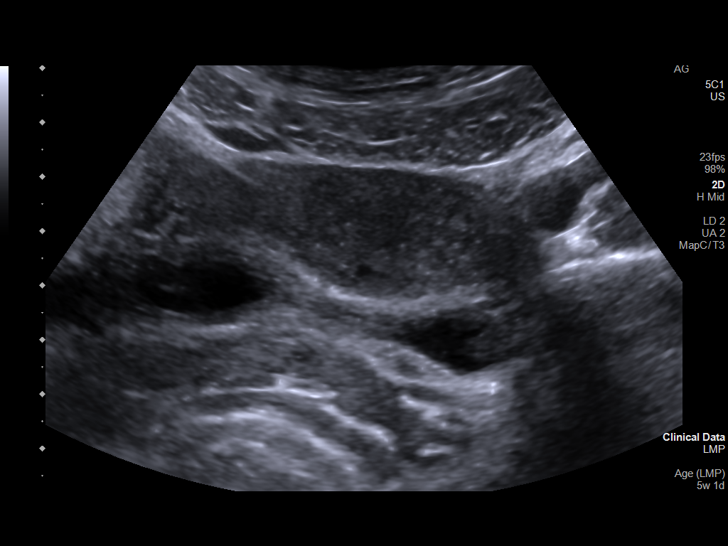
[im 43/164]
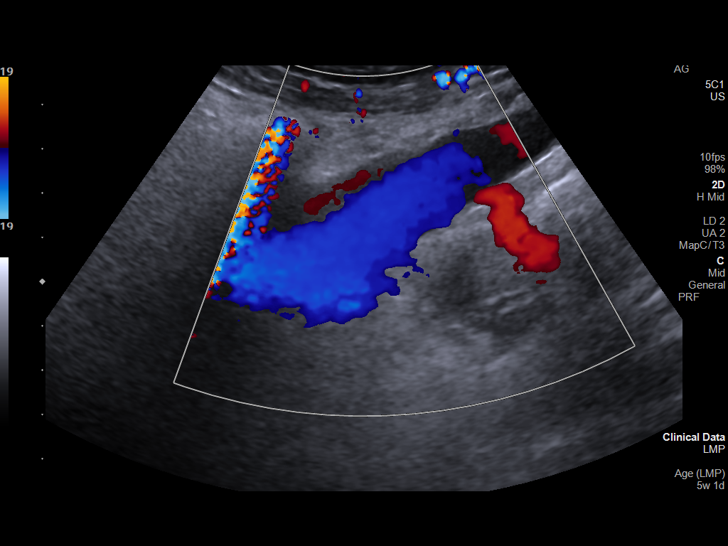
[im 55/164]
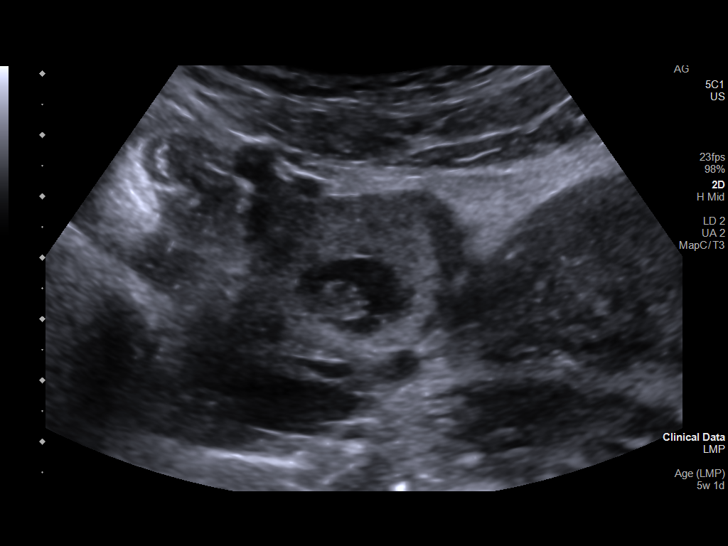
[im 67/164]
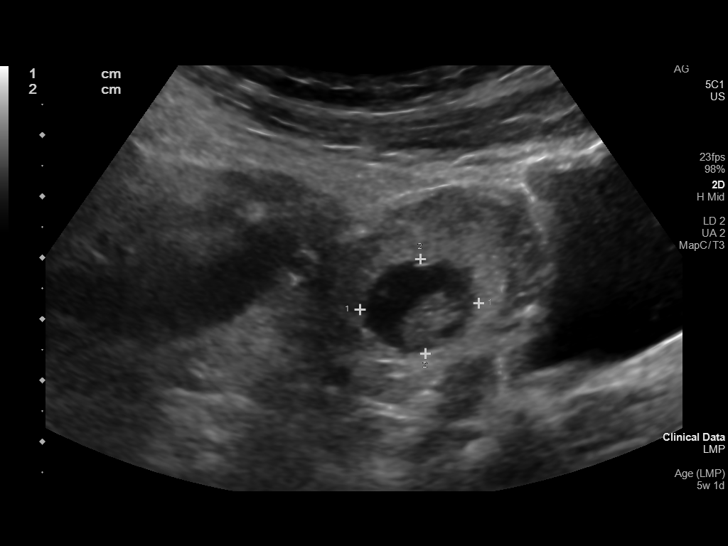
[im 85/164]
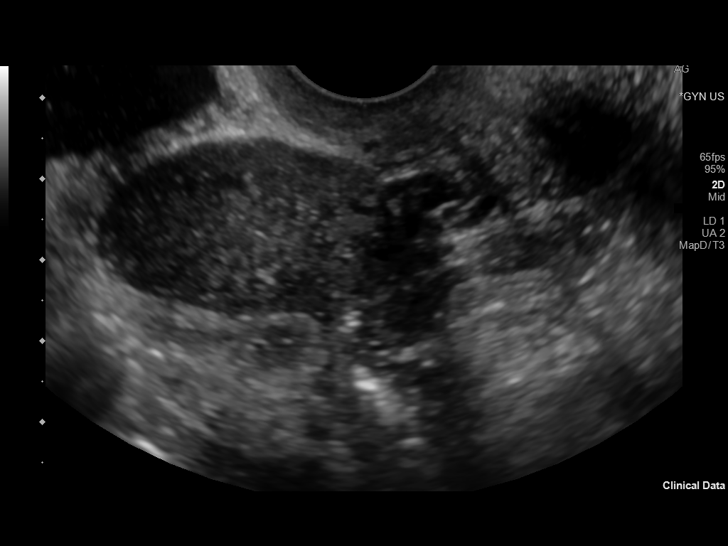
[im 97/164]
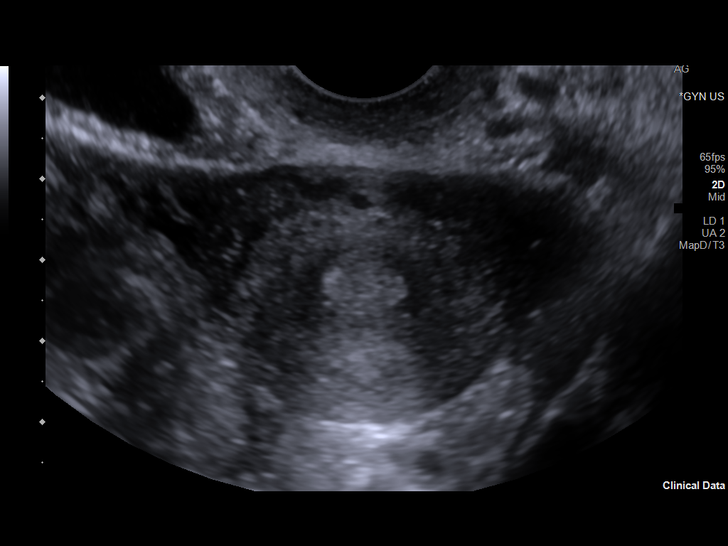
[im 109/164]
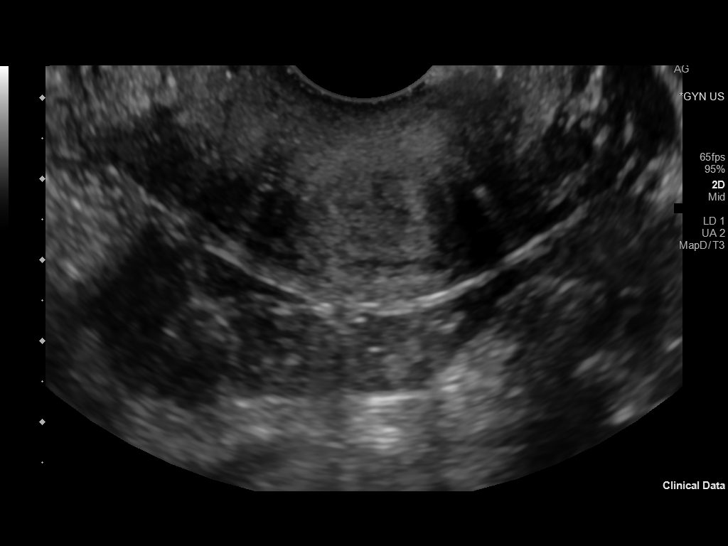
[im 121/164]
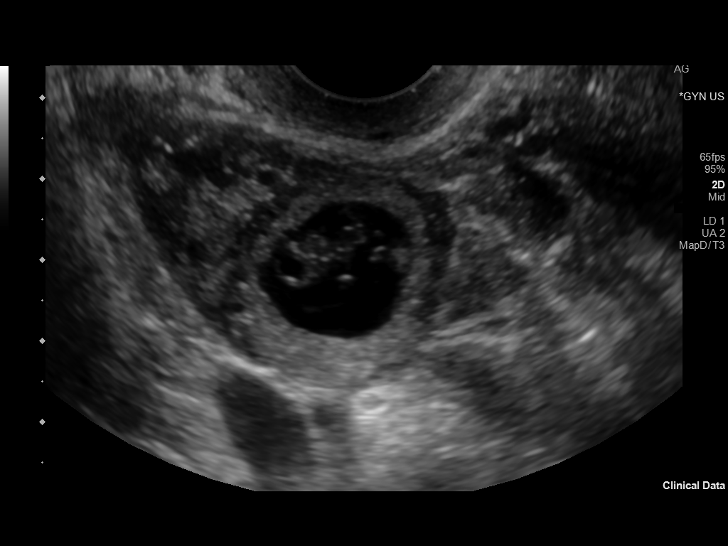
[im 133/164]
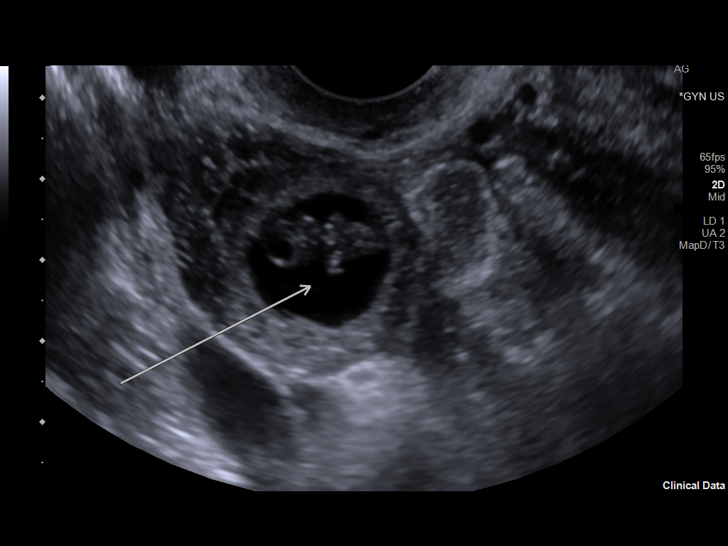
[im 145/164]
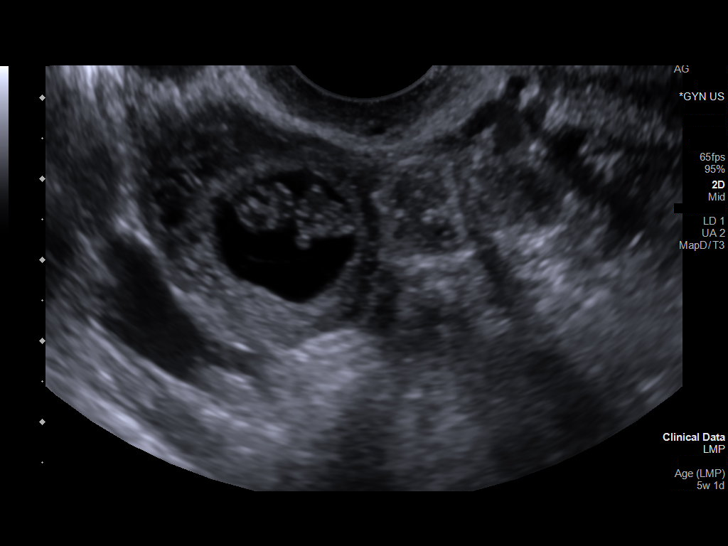
[im 157/164]
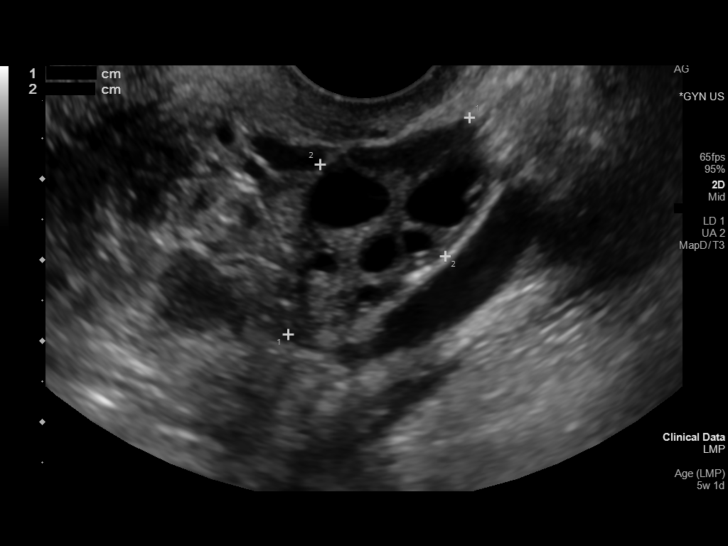

[13 of 28 positions shown; findings below may reference images not displayed]

FINDINGS: Intrauterine gestational sac: No intrauterine gestational sac is
noted.

Maternal uterus/adnexae: A gestational sac is noted within the right
ovary.

Yolk sac:  Present

Embryo:  Present

Cardiac Activity: Present

Heart Rate: 124 bpm

CRL: 14.4 mm 7 w 5 d                  US EDC: 03/14/2019

The uterus is within normal limits. The left ovary is unremarkable.
Minimal free fluid is noted likely physiologic in nature. No
findings to suggest rupture are noted at this time.
IMPRESSION: Changes consistent with right ectopic pregnancy within the ovary
corresponding to a gestational age of 7 weeks 5 days.

Critical Value/emergent results were called by telephone at the time
of interpretation on 07/31/2018 at [DATE] to Dr. GUERITA SOBERANIS ,
who verbally acknowledged these results.

## 2021-07-03 IMAGING — US US OB < 14 WEEKS - US OB TV
1 series · 15 of 28 positions shown · non-contrast
Comparison: None.

CLINICAL DATA: Left lower quadrant pain, history of right ectopic
pregnancy status post salpingectomy, positive urine pregnancy test

EXAM:
OBSTETRIC <14 WK US AND TRANSVAGINAL OB US
TECHNIQUE: Both transabdominal and transvaginal ultrasound examinations were
performed for complete evaluation of the gestation as well as the
maternal uterus, adnexal regions, and pelvic cul-de-sac.
Transvaginal technique was performed to assess early pregnancy.

[Series 1: us ob < 14 weeks - us ob tv · 15 of 79 slices shown]
[im 1/79]
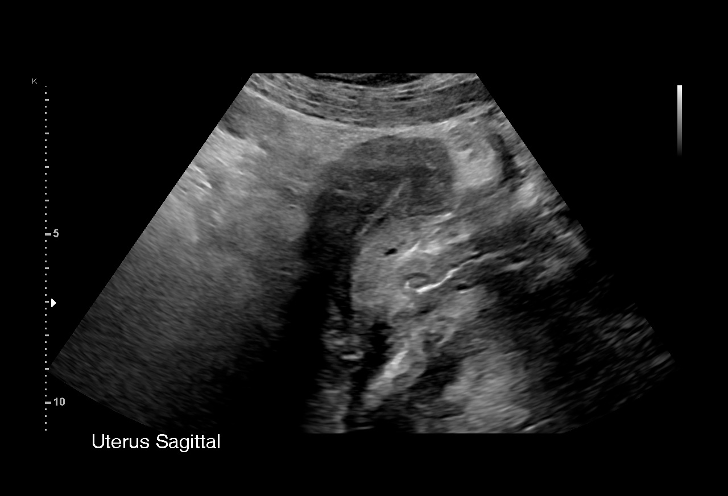
[im 6/79]
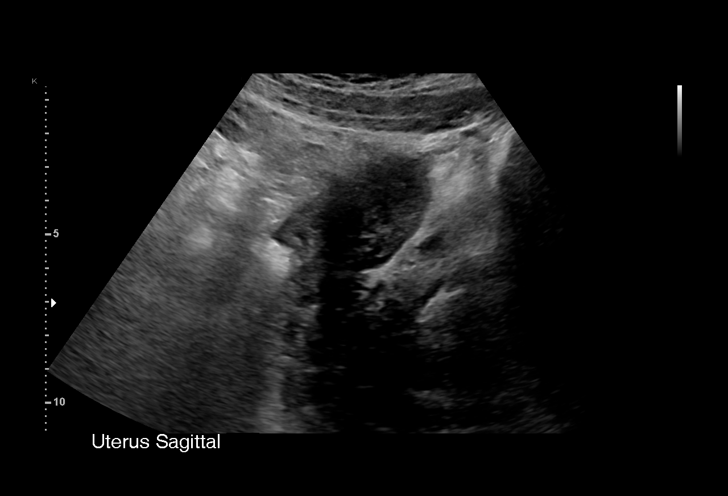
[im 12/79]
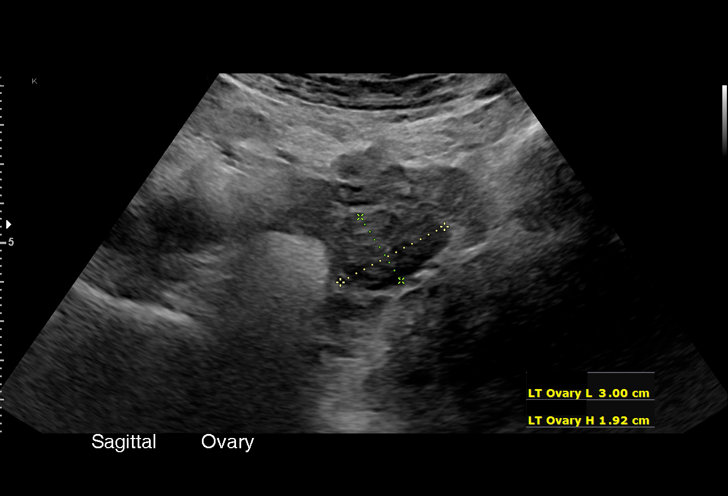
[im 18/79]
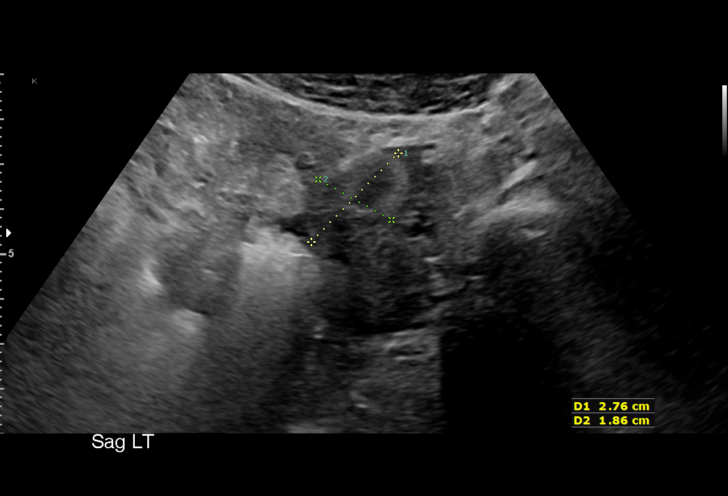
[im 24/79]
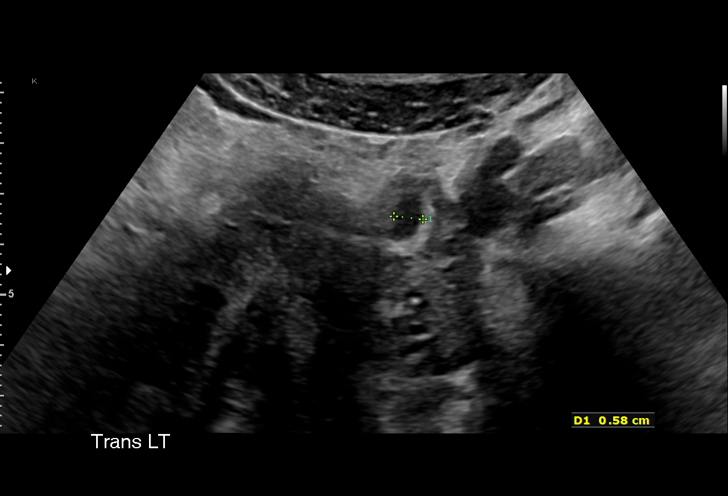
[im 29/79]
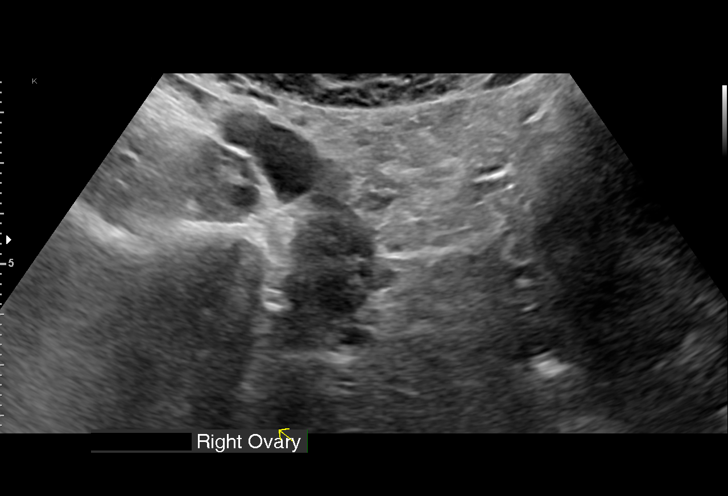
[im 35/79]
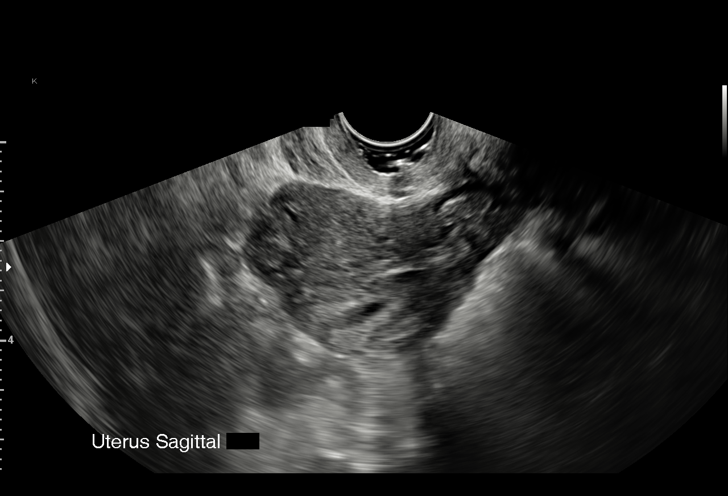
[im 41/79]
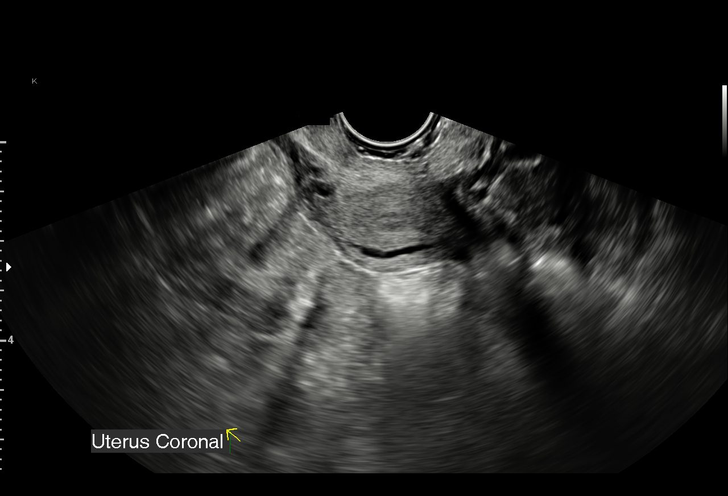
[im 44/79]
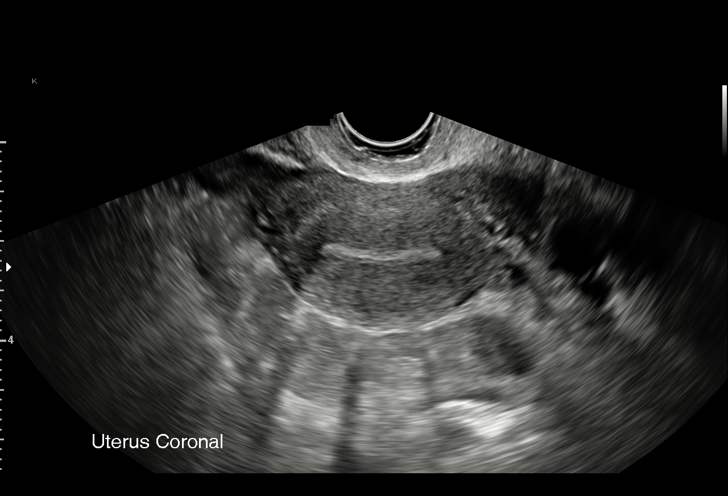
[im 50/79]
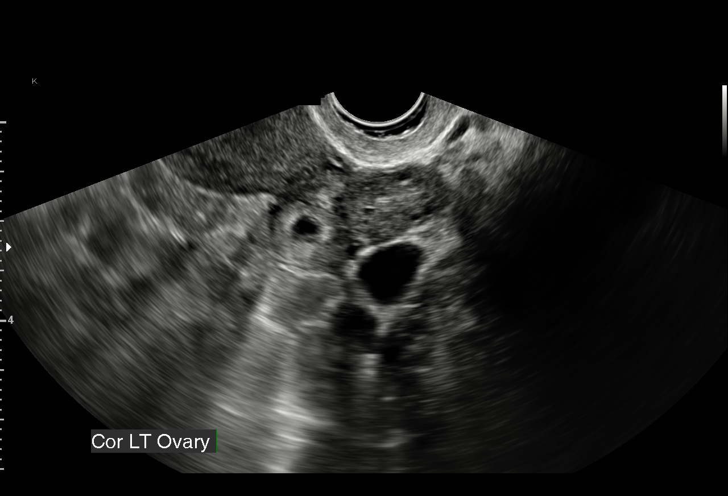
[im 55/79]
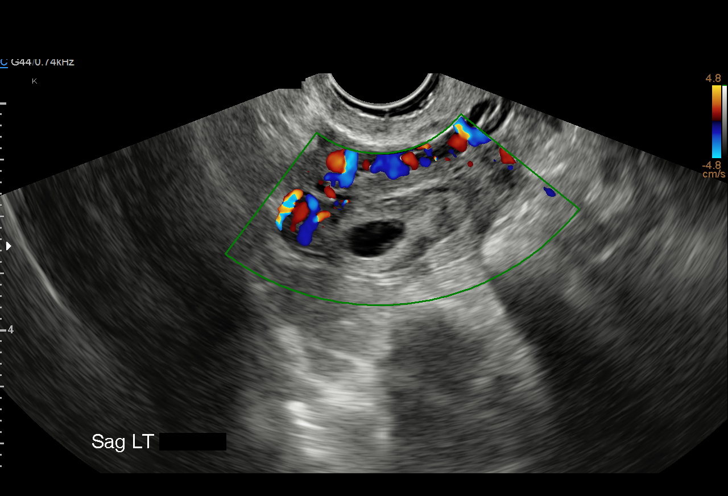
[im 61/79]
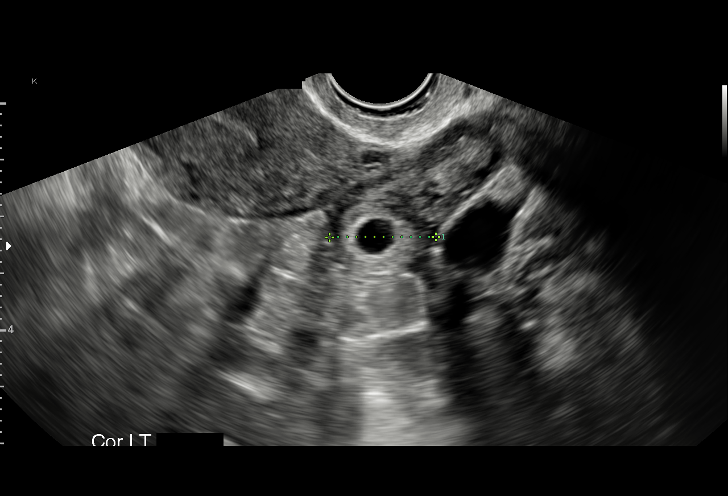
[im 67/79]
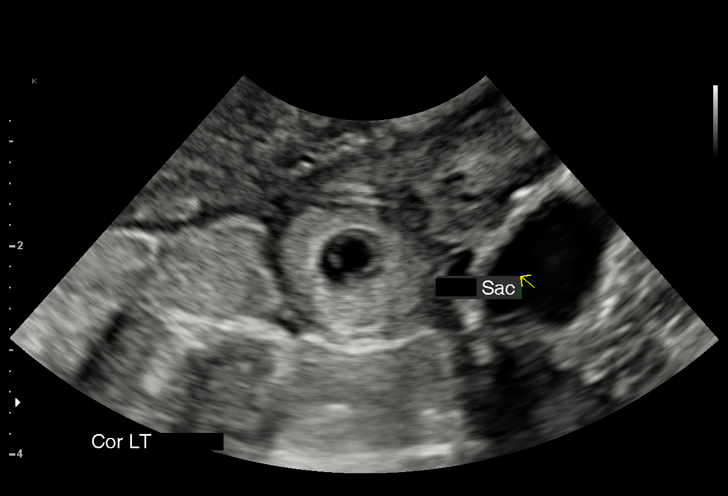
[im 73/79]
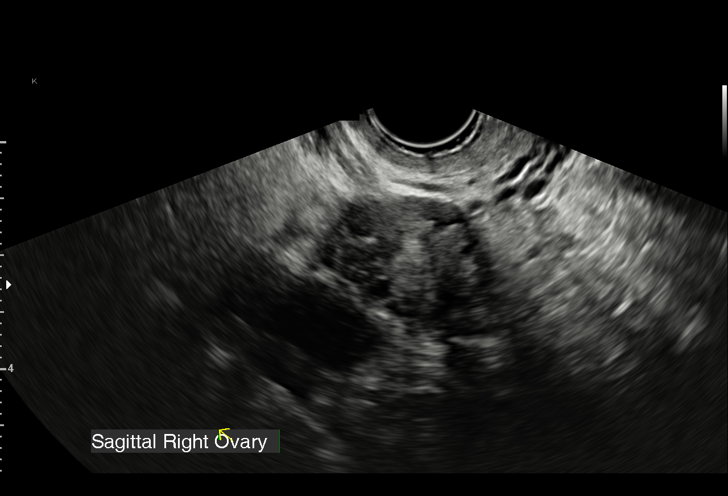
[im 79/79]
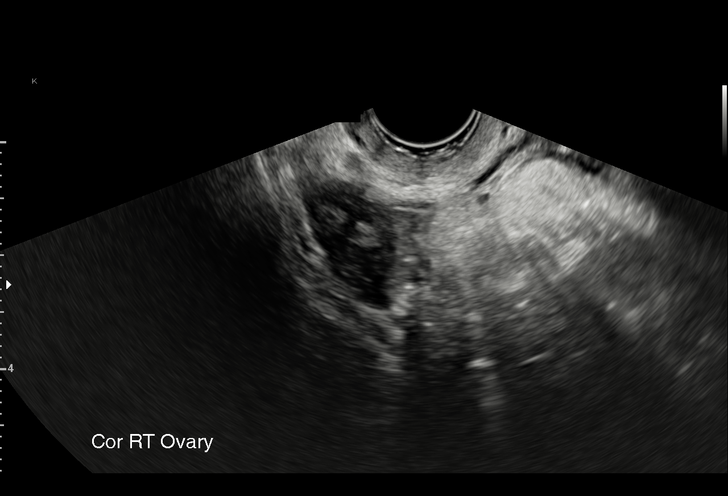

[15 of 28 positions shown; findings below may reference images not displayed]

FINDINGS: Intrauterine gestational sac: None

Maternal uterus/adnexae: Uterus is anteverted. No evidence of
intrauterine pregnancy.

Left ovary measures 3.0 x 1.9 x 1.7 cm. Cystic structure within the
left adnexa measures 11 x 6 x 6 mm, with apparent internal yolk sac,
compatible with ectopic pregnancy. Based on mean sac diameter, this
corresponds to an estimated age of 5 weeks and 4 days. Fetal pole is
not yet visualized. No evidence of free-fluid or rupture.
Gynecologic consultation is recommended.

Right ovary measures 3.0 x 2.2 x 1.9 cm, and is unremarkable.
IMPRESSION: 1. Left adnexal ectopic pregnancy as above, gynecologic consultation
recommended.
2. Unremarkable uterus and right ovary.

These results were called by telephone at the time of interpretation
on 06/24/2019 at [DATE] to provider [facility] , who verbally
acknowledged these results.

## 2021-07-08 IMAGING — US US OB TRANSVAGINAL
1 series · 15 of 27 positions shown · non-contrast
Comparison: Ob ultrasound, 06/24/2019.

CLINICAL DATA: No left ectopic pregnancy. Some pelvic pain and
spotting.

EXAM:
TRANSVAGINAL OB ULTRASOUND
TECHNIQUE: Transvaginal ultrasound was performed for complete evaluation of the
gestation as well as the maternal uterus, adnexal regions, and
pelvic cul-de-sac.

[Series 1: us ob transvaginal · 27 acquisitions, 15 frames shown]
[im 1/27]
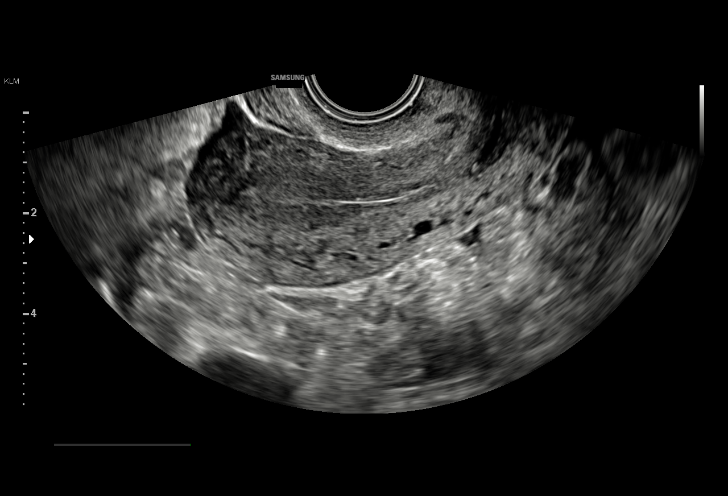
[im 3/27]
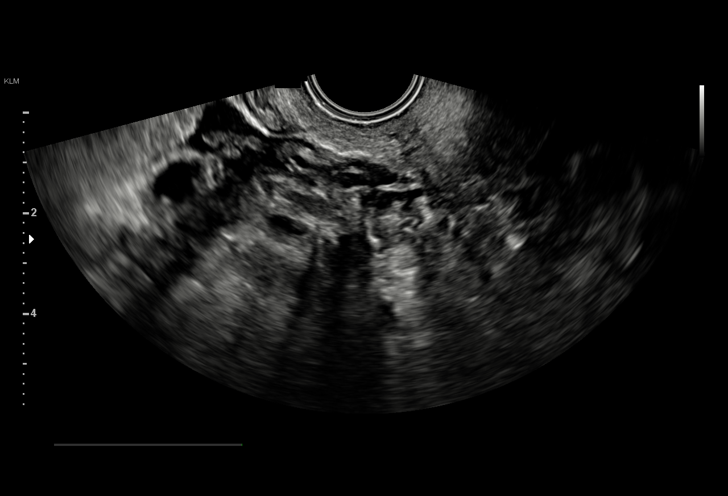
[im 5/27]
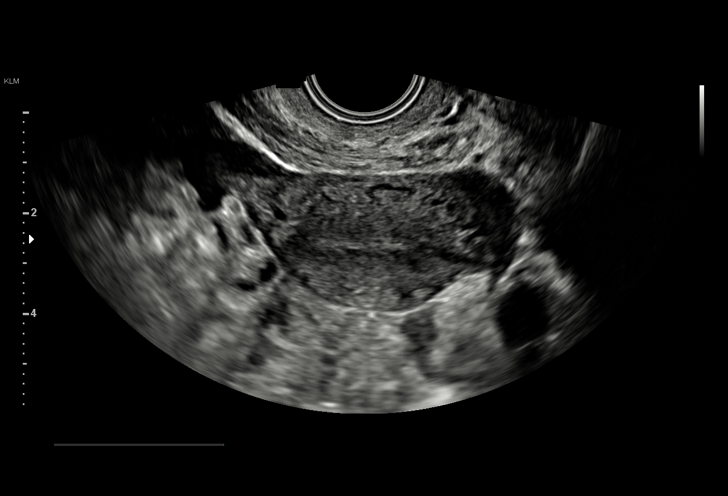
[im 7/27]
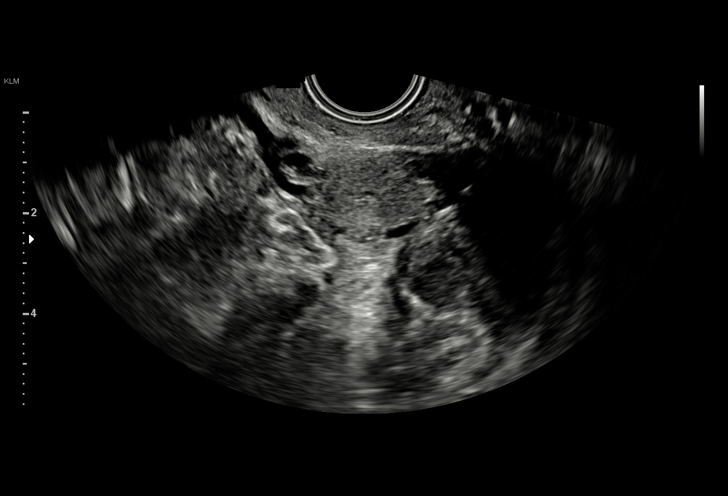
[im 9/27]
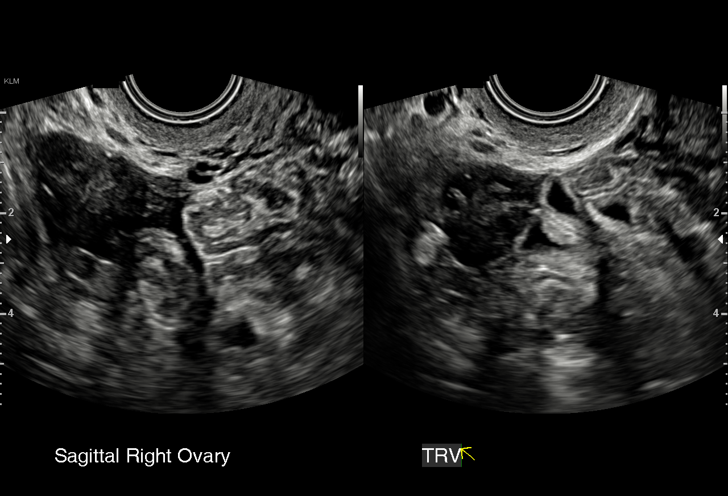
[im 10/27]
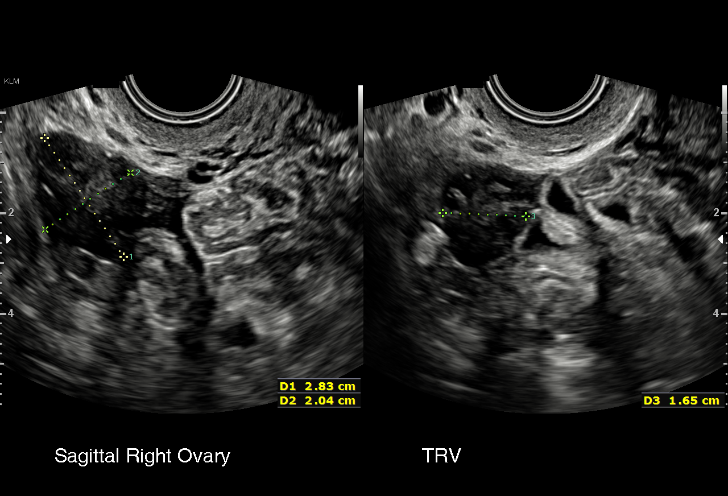
[im 12/27]
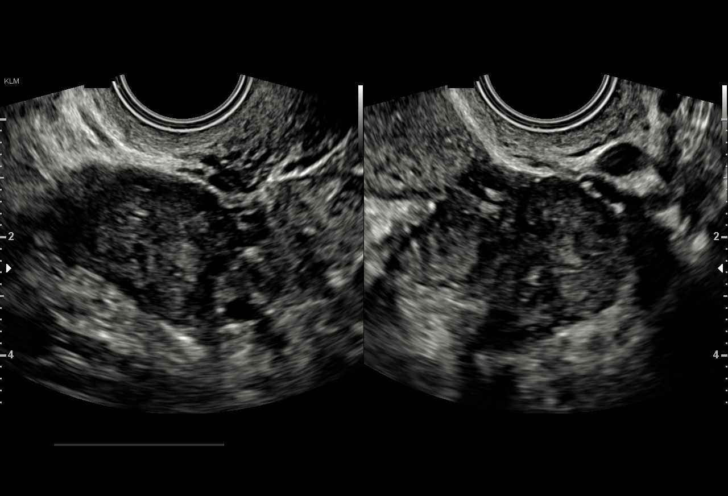
[im 14/27]
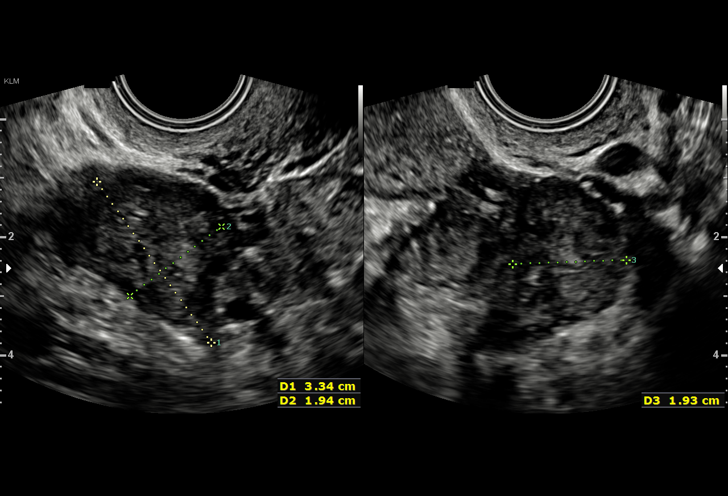
[im 16/27]
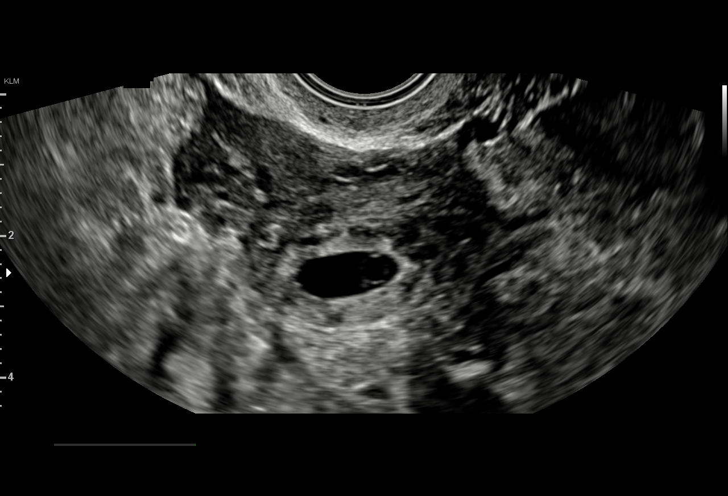
[im 18/27]
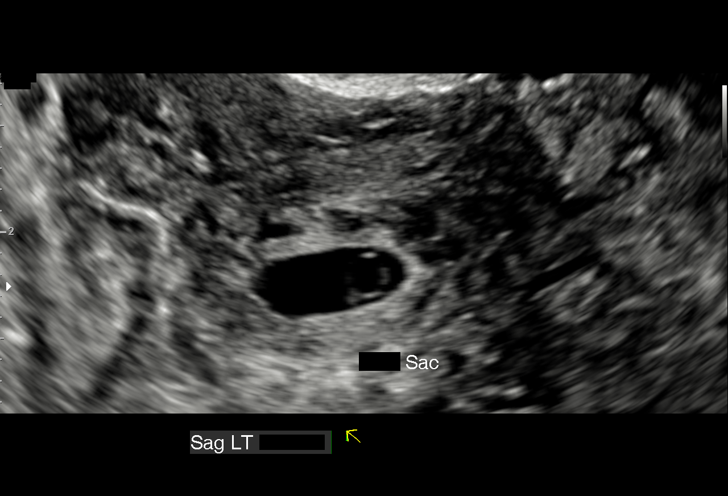
[im 19/27]
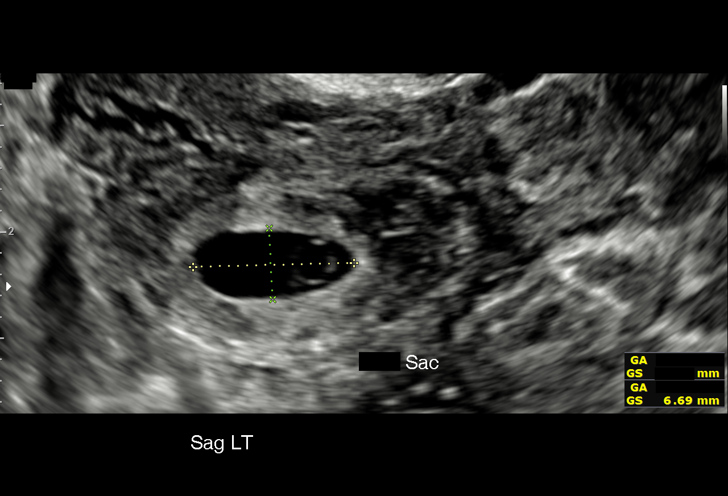
[im 21/27]
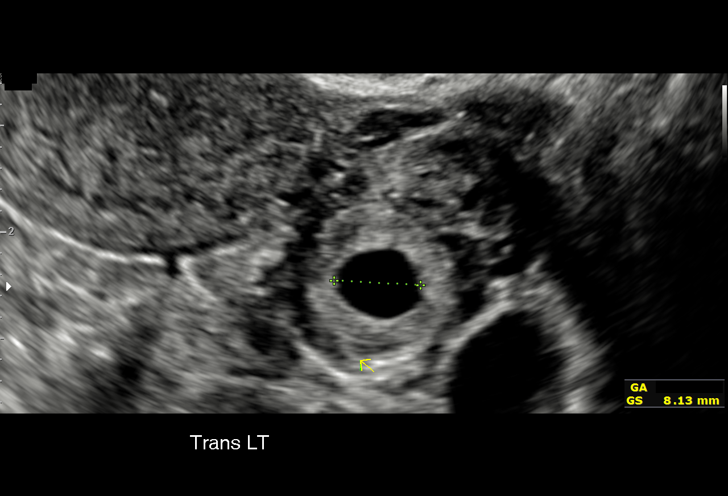
[im 23/27]
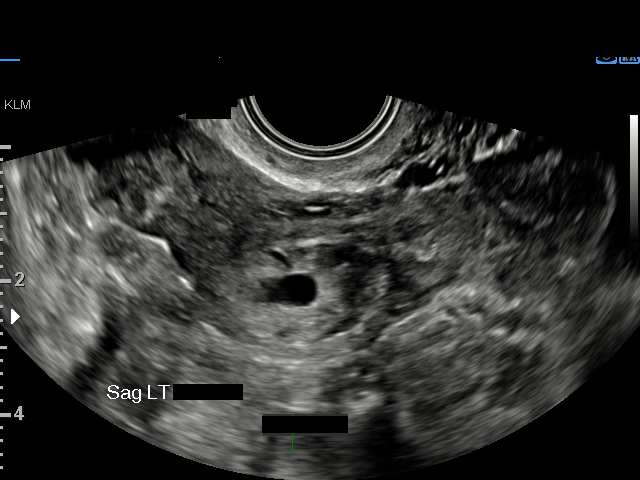
[im 25/27]
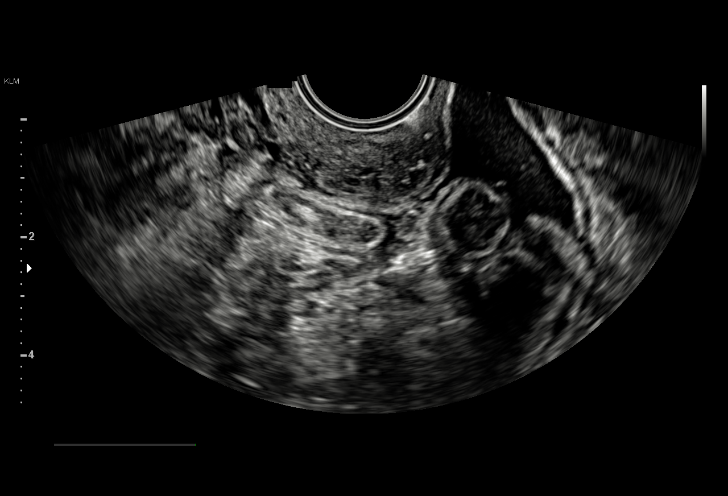
[im 27/27]
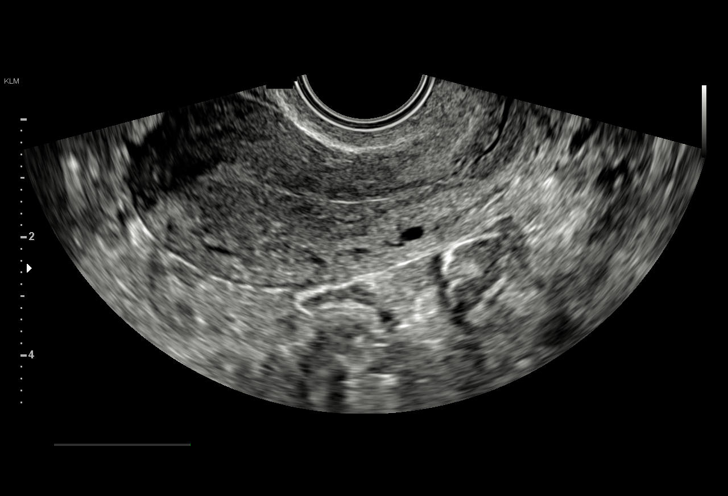

[15 of 27 positions shown; findings below may reference images not displayed]

FINDINGS: Intrauterine gestational sac: None

Left adnexal ectopic pregnancy: Left ectopic pregnancy with a
well-formed yolk sac. The gestational sac along the left adnexa has
slightly enlarged, measuring 9.98 mm, consistent with 5 week, 5 day
gestation. No visualized embryo or cardiac activity. Overall size of
the left adnexal mass/ectopic pregnancy is 2.3 x 1.5 x 1.5 cm.

Maternal uterus/adnexae: Normal uterus. Normal ovaries. Normal right
adnexa.

Small amount of pelvic free fluid.
IMPRESSION: 1. Left ectopic pregnancy with a well-formed gestational sac and
yolk sac, but no visualized embryo. Mean sac diameter is 9.98 mm
consistent with a 5 week, 5 day gestation, which is similar to the
prior ultrasound.
2. Small amount of pelvic free fluid is now noted, which may reflect
minimal rupture or be physiologic.
3. No other abnormalities.

## 2021-07-18 IMAGING — US US OB TRANSVAGINAL
1 series · 15 of 28 positions shown · non-contrast
Comparison: Obstetric ultrasound 06/29/2019

CLINICAL DATA: Pregnant patient with known left adnexal ectopic.
Abdominal pain.

EXAM:
TRANSVAGINAL OB ULTRASOUND
TECHNIQUE: Transvaginal ultrasound was performed for complete evaluation of the
gestation as well as the maternal uterus, adnexal regions, and
pelvic cul-de-sac.

[Series 1: us ob transvaginal · 58 acquisitions, 15 frames shown]
[im 1/58]
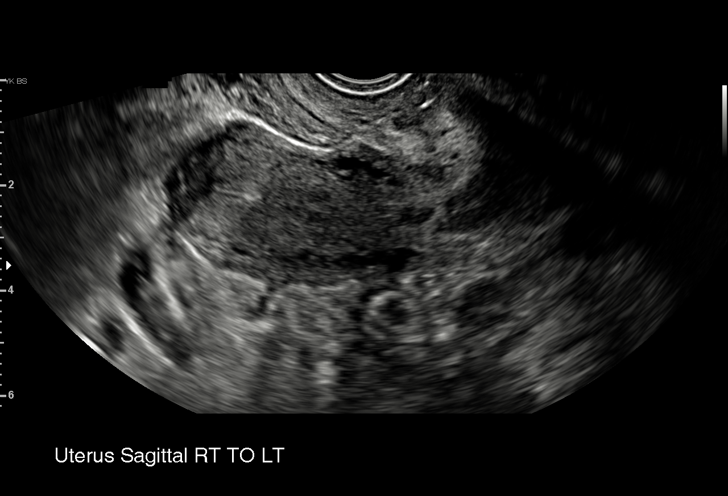
[im 5/58]
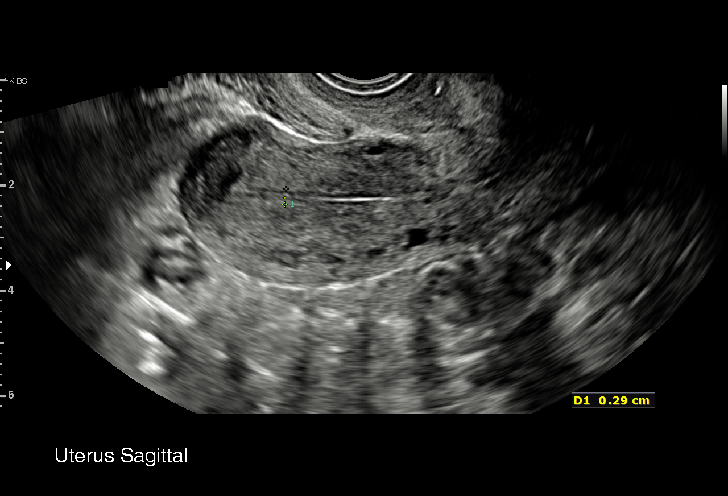
[im 9/58]
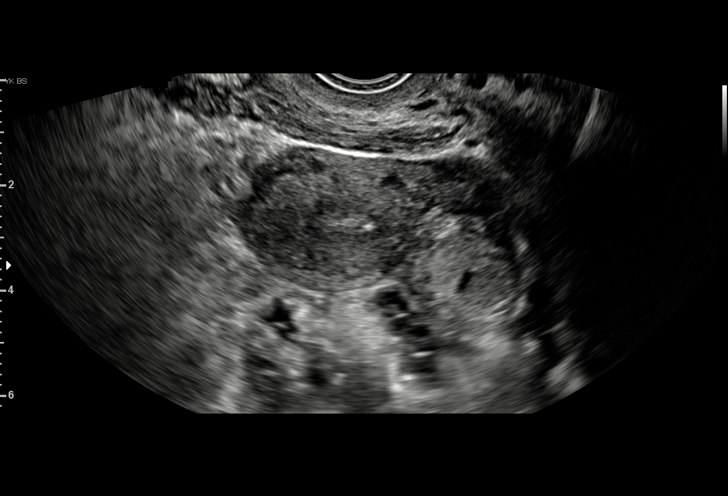
[im 13/58]
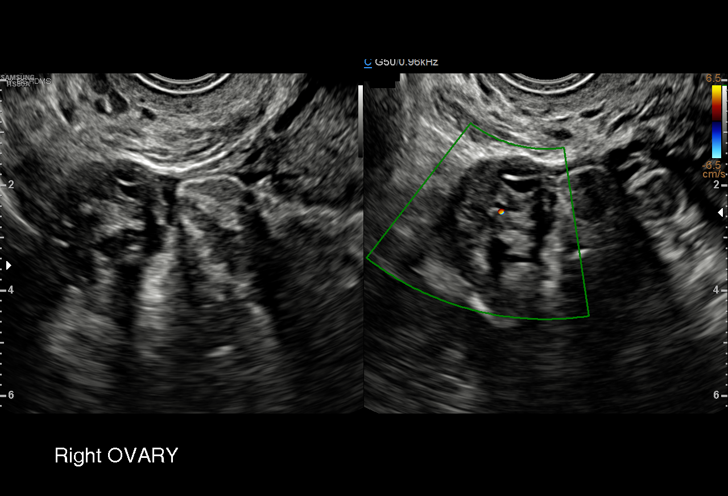
[im 17/58]
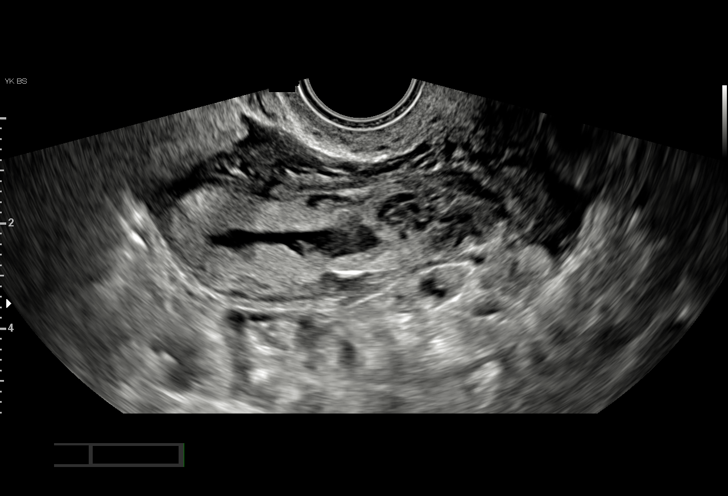
[im 22/58]
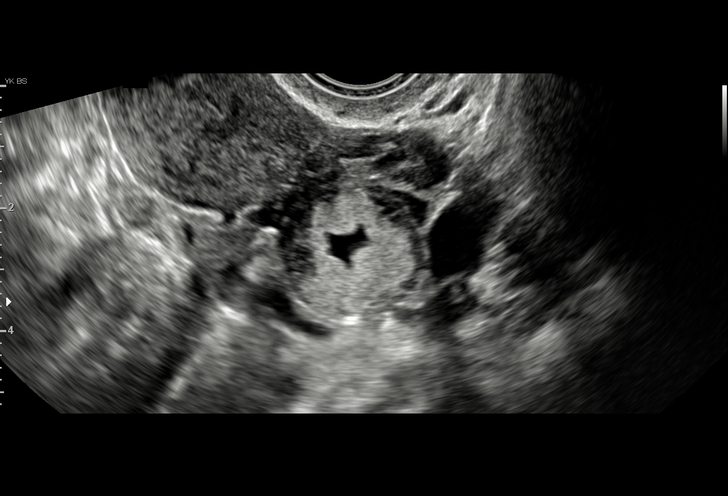
[im 26/58]
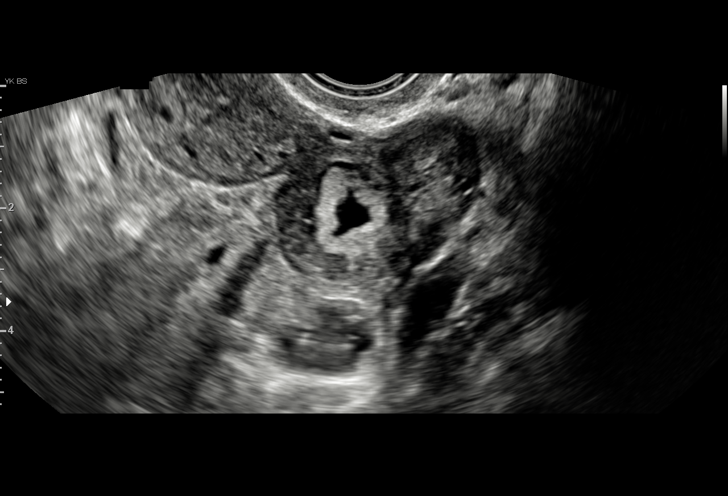
[im 30/58]
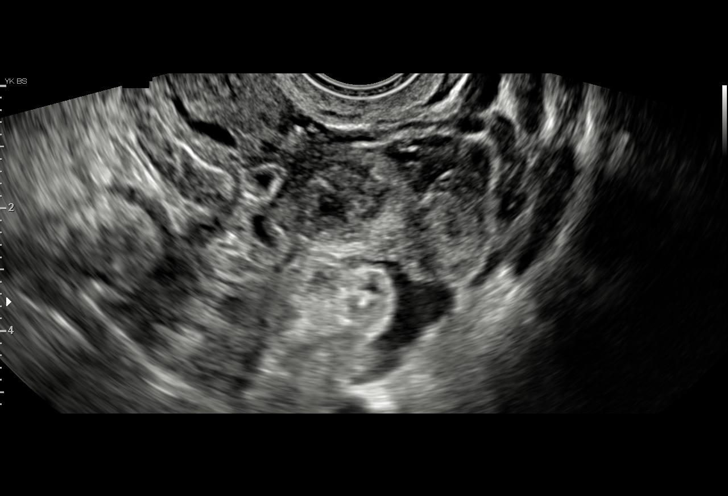
[im 32/58]
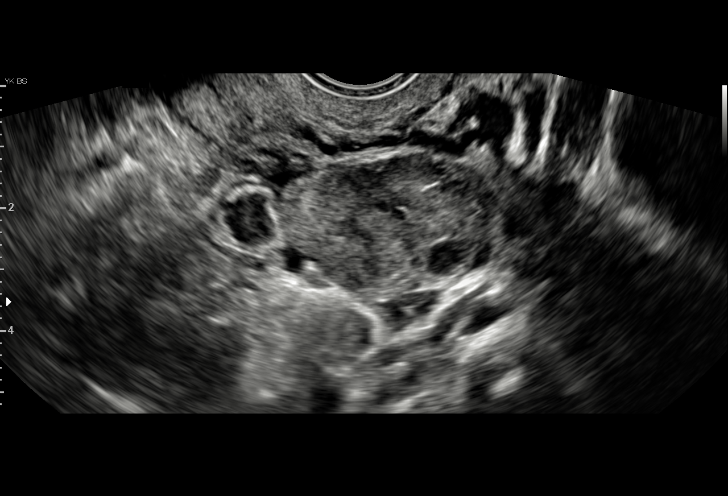
[im 36/58]
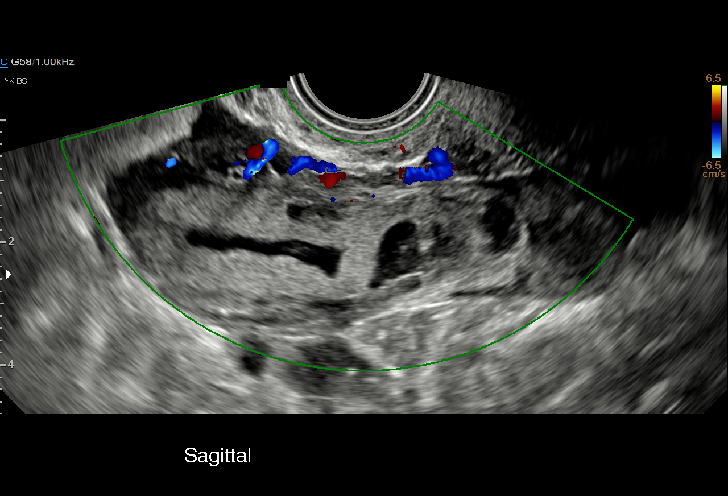
[im 41/58]
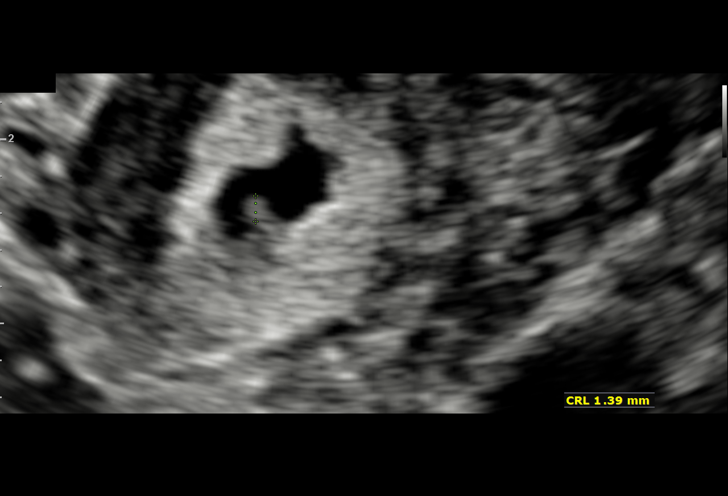
[im 45/58]
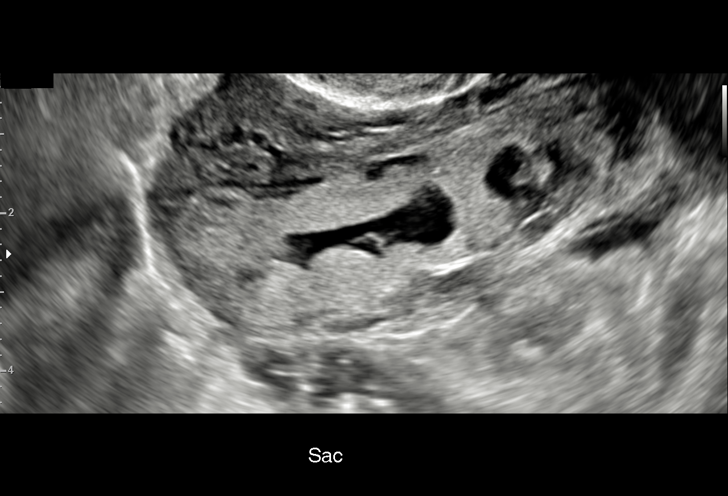
[im 49/58]
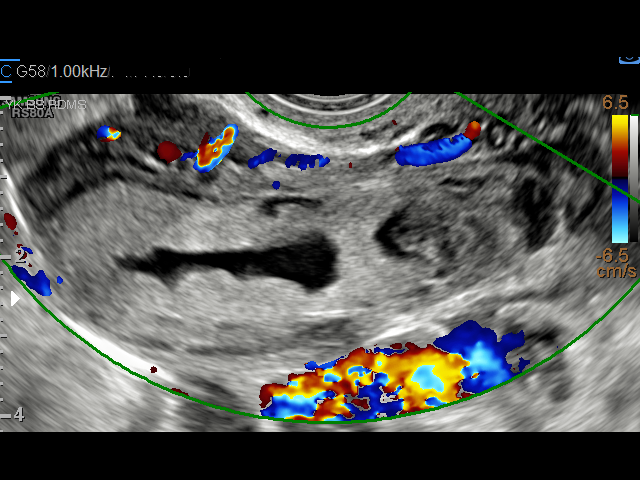
[im 53/58]
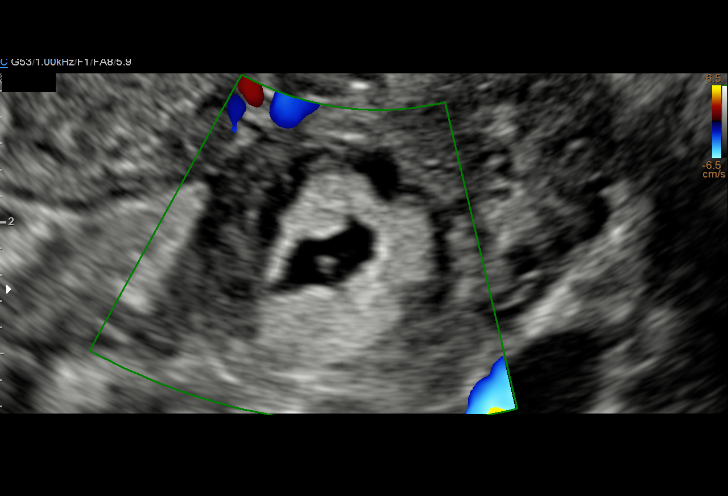
[im 58/58]
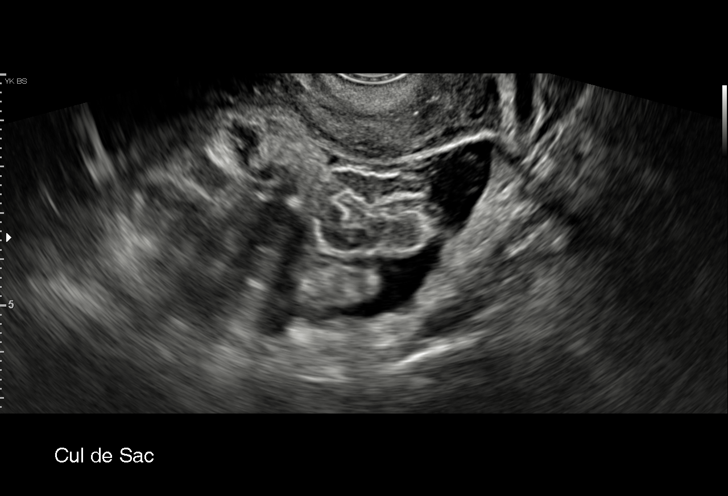

[15 of 28 positions shown; findings below may reference images not displayed]

FINDINGS: Intrauterine gestational sac: No intrauterine gestational sac.

Left adnexal ectopic pregnancy: Known left ectopic pregnancy with a
well-formed gestational sac. Possible yolk sac in this gestational
sac. There is an embryo but no fetal heart tones.

MSD: 9.92 mm, unchanged.  CRL: 1.64 mm

Maternal uterus/adnexae: No intrauterine gestational sac. The
endometrium is thin. No fluid in the endometrial canal. Known left
adnexal ectopic pregnancy with a gestational sac. The previous yolk
sac is not definitively seen, however there is now a small fetal
pole. The left adnexal mass measures 8 x 3 x 3.4 cm, with a
gestational sac measuring 1.6 x 0.8 x 0.5 cm. There is small to
moderate amount of free fluid in the cul-de-sac and left adnexa,
with minimal low level internal echoes. The right ovary is
visualized and normal.
IMPRESSION: Known left ectopic pregnancy. The gestational sac now contains a
fetal pole, but no cardiac activity. The overall size of the adnexal
mass has increased from prior exam. There is a small to moderate
amount of free fluid in the cul-de-sac and left adnexa, also
increased.

These results were called by telephone at the time of interpretation
acknowledged these results.

## 2021-07-19 ENCOUNTER — Other Ambulatory Visit: Payer: Self-pay

## 2021-07-19 ENCOUNTER — Encounter (HOSPITAL_COMMUNITY): Payer: Self-pay

## 2021-07-19 ENCOUNTER — Emergency Department (HOSPITAL_COMMUNITY)
Admission: EM | Admit: 2021-07-19 | Discharge: 2021-07-19 | Disposition: A | Discharge status: Home / Self Care | Payer: BC Managed Care – PPO | Attending: Emergency Medicine | Admitting: Emergency Medicine

## 2021-07-19 DIAGNOSIS — K0889 Other specified disorders of teeth and supporting structures: Secondary | ICD-10-CM | POA: Diagnosis not present

## 2021-07-19 DIAGNOSIS — K047 Periapical abscess without sinus: Secondary | ICD-10-CM | POA: Insufficient documentation

## 2021-07-19 MED ORDER — CLINDAMYCIN HCL 300 MG PO CAPS: 300.0000 mg | ORAL_CAPSULE | Freq: Four times a day (QID) | ORAL | 0 refills | Status: AC

## 2021-07-19 MED ORDER — CLINDAMYCIN HCL 150 MG PO CAPS
300.0000 mg | ORAL_CAPSULE | Freq: Once | ORAL | Status: AC
  Administered 2021-07-19: 300 mg via ORAL
  Filled 2021-07-19: qty 2

## 2021-07-19 MED ORDER — IBUPROFEN 800 MG PO TABS
800.0000 mg | ORAL_TABLET | Freq: Once | ORAL | Status: AC
  Administered 2021-07-19: 800 mg via ORAL
  Filled 2021-07-19: qty 1

## 2021-07-19 MED ORDER — IBUPROFEN 800 MG PO TABS: 800.0000 mg | ORAL_TABLET | Freq: Three times a day (TID) | ORAL | 0 refills | Status: AC

## 2021-07-19 NOTE — ED Triage Notes (Signed)
Pt presents with bottom right dental pain that started x2 days ago. Swelling noted to right side of mouth. Pt called for a dental appointment. ?

## 2021-07-19 NOTE — ED Provider Notes (Signed)
? EMERGENCY DEPARTMENT ?Provider Note ? ? ?CSN: [id number] ?Arrival date & time: 07/19/21  0539 ? ?  ? ?History ? ?No chief complaint on file. ? ? ?Heidi Owens is a 36 y.o. female. ? ?HPI ? ?  ? ?Heidi Owens is a 36 y.o. female who presents to the Emergency Department complaining of facial swelling and dental pain.  Symptoms began 2 days ago.  Notes increased facial swelling upon waking this morning.  She has an upcoming dental appointment.  Pain associated to right face is with chewing.  Pain radiating toward her nose.  She denies any fever, chills, difficulty swallowing or breathing and neck pain. ? ?Home Medications ?Prior to Admission medications   ?Medication Sig Start Date End Date Taking? Authorizing Provider  ?ibuprofen (ADVIL) 600 MG tablet Take 1 tablet (600 mg total) by mouth every 6 (six) hours as needed. 07/09/19   Catalina Pizza, MD  ?   ? ?Allergies    ?Other   ? ?Review of Systems   ?Review of Systems  ?Constitutional:  Negative for appetite change, chills and fever.  ?HENT:  Positive for dental problem and facial swelling. Negative for congestion, ear pain, sore throat and trouble swallowing.   ?Respiratory:  Negative for shortness of breath.   ?Cardiovascular:  Negative for chest pain.  ?Gastrointestinal:  Negative for nausea and vomiting.  ?Neurological:  Negative for weakness and headaches.  ? ?Physical Exam ?Updated Vital Signs ?BP 119/77   Pulse 80   Temp 98.7 ?F (37.1 ?C)   Resp 18   Ht 5\' 5"  (1.651 m)   Wt 77.1 kg   SpO2 100%   BMI 28.29 kg/m?  ?Physical Exam ?Vitals and nursing note reviewed.  ?Constitutional:   ?   General: She is not in acute distress. ?   Appearance: Normal appearance.  ?HENT:  ?   Right Ear: Tympanic membrane and ear canal normal.  ?   Left Ear: Tympanic membrane and ear canal normal.  ?   Mouth/Throat:  ?   Mouth: Mucous membranes are moist.  ?   Dentition: Abnormal dentition. Dental tenderness, gingival swelling and dental caries present.  ?    Pharynx: Oropharynx is clear. Uvula midline. No oropharyngeal exudate, posterior oropharyngeal erythema or uvula swelling.  ?   Comments: Tenderness to palpation at the right upper second molar.  There is some erythema and swelling of the surrounding gingiva.  No fluctuance or tenting of the gums.  Mild right-sided facial swelling.  Uvula midline nonedematous.  No sublingual abnormalities no trismus. ?Eyes:  ?   Extraocular Movements: Extraocular movements intact.  ?Cardiovascular:  ?   Rate and Rhythm: Normal rate and regular rhythm.  ?   Pulses: Normal pulses.  ?Pulmonary:  ?   Effort: Pulmonary effort is normal.  ?   Breath sounds: Normal breath sounds.  ?Musculoskeletal:     ?   General: Normal range of motion.  ?   Cervical back: Normal range of motion. No tenderness.  ?Lymphadenopathy:  ?   Cervical: No cervical adenopathy.  ?Skin: ?   General: Skin is warm.  ?   Capillary Refill: Capillary refill takes less than 2 seconds.  ?   Findings: No rash.  ?Neurological:  ?   General: No focal deficit present.  ?   Mental Status: She is alert.  ?   Sensory: No sensory deficit.  ?   Motor: No weakness.  ? ? ?ED Results / Procedures / Treatments   ?  Labs ?(all labs ordered are listed, but only abnormal results are displayed) ?Labs Reviewed - No data to display ? ?EKG ?None ? ?Radiology ?No results found. ? ?Procedures ?Procedures  ? ? ?Medications Ordered in ED ?Medications  ?clindamycin (CLEOCIN) capsule 300 mg (has no administration in time range)  ?ibuprofen (ADVIL) tablet 800 mg (has no administration in time range)  ? ? ?ED Course/ Medical Decision Making/ A&P ?  ?                        ?Medical Decision Making ?Risk ?Prescription drug management. ? ? ?Patient here for evaluation of dental pain x2 days.  Noticed facial swelling yesterday.  Worse this morning.  On exam, patient well-appearing nontoxic.  She does have some edema of the right face.  There is tenderness over the right upper second premolar with  erythema and edema of the surrounding gingiva.  There is no drainable abscess noted.  No fever, neck pain, sublingual abnormalities or other findings suggestive of Ludewig's angina. ?Patient has appointment with her dentist for next week.  I feel that she is appropriate for discharge home at this time will start antibiotic treatment and short course of pain medication.  She was given return precautions. ? ? ? ? ? ? ? ?Final Clinical Impression(s) / ED Diagnoses ?Final diagnoses:  ?Dental abscess  ? ? ?Rx / DC Orders ?ED Discharge Orders   ? ? None  ? ?  ? ? ?  ?Pauline Aus, PA-C ?07/21/21 1350 ? ?  ?Vanetta Mulders, MD ?07/24/21 1613 ? ?

## 2021-07-19 NOTE — Discharge Instructions (Signed)
Take the antibiotic as directed until its finished.  Warm salt water rinses or you may rinse with half peroxide and half water solution.  Please keep your upcoming dental appointment.  Return the emergency department for any new or worsening symptoms. ?

## 2022-10-20 ENCOUNTER — Emergency Department (HOSPITAL_COMMUNITY)
Admission: EM | Admit: 2022-10-20 | Discharge: 2022-10-20 | Disposition: A | Discharge status: Home / Self Care | Payer: BC Managed Care – PPO | Attending: Emergency Medicine | Admitting: Emergency Medicine

## 2022-10-20 ENCOUNTER — Other Ambulatory Visit: Payer: Self-pay

## 2022-10-20 ENCOUNTER — Encounter (HOSPITAL_COMMUNITY): Payer: Self-pay

## 2022-10-20 DIAGNOSIS — R55 Syncope and collapse: Secondary | ICD-10-CM | POA: Diagnosis present

## 2022-10-20 LAB — URINALYSIS, ROUTINE W REFLEX MICROSCOPIC
Bacteria, UA: NONE SEEN
Bilirubin Urine: NEGATIVE
Glucose, UA: NEGATIVE mg/dL
Hgb urine dipstick: NEGATIVE
Ketones, ur: NEGATIVE mg/dL
Nitrite: NEGATIVE
Protein, ur: NEGATIVE mg/dL
Specific Gravity, Urine: 1.018 (ref 1.005–1.030)
pH: 7 (ref 5.0–8.0)

## 2022-10-20 LAB — BASIC METABOLIC PANEL
Anion gap: 8 (ref 5–15)
BUN: 11 mg/dL (ref 6–20)
CO2: 24 mmol/L (ref 22–32)
Calcium: 9.2 mg/dL (ref 8.9–10.3)
Chloride: 103 mmol/L (ref 98–111)
Creatinine, Ser: 0.76 mg/dL (ref 0.44–1.00)
GFR, Estimated: >60 mL/min (ref >60)
Glucose, Bld: 93 mg/dL (ref 70–99)
Potassium: 4.2 mmol/L (ref 3.5–5.1)
Sodium: 135 mmol/L (ref 135–145)

## 2022-10-20 LAB — CBC
HCT: 42 % (ref 36.0–46.0)
Hemoglobin: 13.9 g/dL (ref 12.0–15.0)
MCH: 31.3 pg (ref 26.0–34.0)
MCHC: 33.1 g/dL (ref 30.0–36.0)
MCV: 94.6 fL (ref 80.0–100.0)
Platelets: 316 10*3/uL (ref 150–400)
RBC: 4.44 MIL/uL (ref 3.87–5.11)
RDW: 13.2 % (ref 11.5–15.5)
WBC: 8.9 10*3/uL (ref 4.0–10.5)
nRBC: 0 % (ref 0.0–0.2)

## 2022-10-20 LAB — CBG MONITORING, ED: Glucose-Capillary: 88 mg/dL (ref 70–99)

## 2022-10-20 LAB — POC URINE PREG, ED: Preg Test, Ur: NEGATIVE

## 2022-10-20 NOTE — ED Triage Notes (Signed)
Pt reports she has had 2 episodes of feeling like she is going to pass out. Tho most recent was this past Friday.

## 2022-10-20 NOTE — ED Provider Notes (Signed)
[place] EMERGENCY DEPARTMENT AT [facility] Provider Note   CSN: [id number] Arrival date & time: 10/20/22  1003     History  Chief Complaint  Patient presents with   Near Syncope    Heidi Owens is a 37 y.o. female.   Near Syncope Pertinent negatives include no chest pain, no abdominal pain and no shortness of breath.        Heidi Owens is a 37 y.o. female who presents to the Emergency Department complaining of episode of brief near syncope this morning.  States she did not pass out but felt lightheaded and "things went black.".  She was sitting at the time the incident occurred.  She endorses having a similar episode 3 days ago in which she did syncopized.  No prior history of arrhythmias, new medications, increased stressors headaches or chest pain.  Denies any symptoms at present.   Home Medications Prior to Admission medications   Medication Sig Start Date End Date Taking? Authorizing Provider  clindamycin (CLEOCIN) 300 MG capsule Take 1 capsule (300 mg total) by mouth 4 (four) times daily. 07/19/21   , , PA-C  ibuprofen (ADVIL) 800 MG tablet Take 1 tablet (800 mg total) by mouth 3 (three) times daily. Take with food 07/19/21   , , PA-C      Allergies    Other    Review of Systems   Review of Systems  Constitutional:  Negative for appetite change, chills and fever.  Eyes:  Negative for visual disturbance.  Respiratory:  Negative for shortness of breath.   Cardiovascular:  Positive for near-syncope. Negative for chest pain.  Gastrointestinal:  Negative for abdominal pain, diarrhea and nausea.  Musculoskeletal:  Negative for arthralgias, myalgias, neck pain and neck stiffness.  Neurological:  Positive for syncope. Negative for dizziness, weakness, light-headedness and numbness.  Psychiatric/Behavioral:  Negative for confusion.     Physical Exam Updated Vital Signs BP 130/87 (BP Location: Right Arm)   Pulse (!) 58   Temp  98.6 F (37 C) (Oral)   Resp 18   Ht 5\' 5"  (1.651 m)   Wt 74.8 kg   LMP 10/02/2022 (Exact Date)   SpO2 100%   BMI 27.46 kg/m  Physical Exam Vitals and nursing note reviewed. Exam conducted with a chaperone present.  Constitutional:      General: She is not in acute distress.    Appearance: Normal appearance. She is not ill-appearing or toxic-appearing.  Eyes:     Extraocular Movements: Extraocular movements intact.     Conjunctiva/sclera: Conjunctivae normal.     Pupils: Pupils are equal, round, and reactive to light.  Neck:     Meningeal: Kernig's sign absent.  Cardiovascular:     Rate and Rhythm: Normal rate and regular rhythm.     Pulses: Normal pulses.  Pulmonary:     Effort: Pulmonary effort is normal.  Abdominal:     Palpations: Abdomen is soft.     Tenderness: There is no abdominal tenderness.  Genitourinary:    Labia:        Right: No rash or tenderness.        Left: No rash or tenderness.   Musculoskeletal:        General: Normal range of motion.     Cervical back: Full passive range of motion without pain and normal range of motion. No rigidity.  Lymphadenopathy:     Lower Body: No right inguinal adenopathy. No left inguinal adenopathy.  Skin:  General: Skin is warm.     Capillary Refill: Capillary refill takes less than 2 seconds.  Neurological:     General: No focal deficit present.     Mental Status: She is alert.     Cranial Nerves: Cranial nerves 2-12 are intact.     Sensory: Sensation is intact. No sensory deficit.     Motor: Motor function is intact. No weakness.     Coordination: Coordination normal.     Comments: CN II through XII intact.  Speech clear.  Mentating well.  No facial droop, no pronator drift.     ED Results / Procedures / Treatments   Labs (all labs ordered are listed, but only abnormal results are displayed) Labs Reviewed  URINALYSIS, ROUTINE W REFLEX MICROSCOPIC - Abnormal; Notable for the following components:      Result  Value   Leukocytes,Ua TRACE (*)    All other components within normal limits  BASIC METABOLIC PANEL  CBC  CBG MONITORING, ED  POC URINE PREG, ED    EKG EKG Interpretation Date/Time:  Monday October 20 2022 15:12:25 EDT Ventricular Rate:  61 PR Interval:  162 QRS Duration:  85 QT Interval:  452 QTC Calculation: 456 R Axis:   70  Text Interpretation: Sinus rhythm No significant change since last tracing Confirmed by Cathren Laine ([id number]) on 10/20/2022 4:00:45 PM  Radiology No results found.  Procedures Procedures    Medications Ordered in ED Medications - No data to display  ED Course/ Medical Decision Making/ A&P                                 Medical Decision Making Patient here with episode of near syncope this morning that she describes as brief in duration.  Endorses a syncopal episode 3 days ago that was also brief in duration.  Witnessed by friend.  No reported seizure activity.  No incontinence or tongue biting.  History of cardiac arrhythmias or seizures.   Clinically, I suspect near syncope possibly related to arrhythmia versus electrolyte abnormality.  Infection also considered.  Amount and/or Complexity of Data Reviewed Labs: ordered.    Details: Labs interpreted by me, no evidence of leukocytosis or anemia, bmet without derangement.  Urine pregnancy test negative, urinalysis without evidence of UTI Radiology: ordered. ECG/medicine tests: ordered.    Details: EKG shows sinus rhythm no significant change since last tracing Discussion of management or test interpretation with external provider(s): On recheck, patient resting comfortably.  cause of her near syncope unclear at this time.  Her workup today is overall reassuring.  She does have a PCP and agrees to close follow-up.  Symptoms may be stress related.  Appears appropriate for discharge home at this time, will follow-up with her PCP.           Final Clinical Impression(s) / ED Diagnoses Final  diagnoses:  Near syncope    Rx / DC Orders ED Discharge Orders     None         Pauline Aus, PA-C 10/20/22 1626    Gloris Manchester, MD 10/22/22 1626

## 2022-10-20 NOTE — Discharge Instructions (Signed)
Follow-up with your primary care provider for recheck.  Return to emergency department for any new or worsening symptoms.

## 2023-05-02 ENCOUNTER — Emergency Department (HOSPITAL_COMMUNITY): Payer: BC Managed Care – PPO

## 2023-05-02 ENCOUNTER — Encounter (HOSPITAL_COMMUNITY): Payer: Self-pay

## 2023-05-02 ENCOUNTER — Other Ambulatory Visit: Payer: Self-pay

## 2023-05-02 ENCOUNTER — Emergency Department (HOSPITAL_COMMUNITY)
Admission: EM | Admit: 2023-05-02 | Discharge: 2023-05-02 | Disposition: A | Discharge status: Home / Self Care | Payer: BC Managed Care – PPO | Attending: Emergency Medicine | Admitting: Emergency Medicine

## 2023-05-02 DIAGNOSIS — R0602 Shortness of breath: Secondary | ICD-10-CM | POA: Insufficient documentation

## 2023-05-02 DIAGNOSIS — J101 Influenza due to other identified influenza virus with other respiratory manifestations: Secondary | ICD-10-CM | POA: Insufficient documentation

## 2023-05-02 DIAGNOSIS — J111 Influenza due to unidentified influenza virus with other respiratory manifestations: Secondary | ICD-10-CM

## 2023-05-02 MED ORDER — ALBUTEROL SULFATE HFA 108 (90 BASE) MCG/ACT IN AERS
2.0000 | INHALATION_SPRAY | RESPIRATORY_TRACT | Status: DC | PRN
  Administered 2023-05-02: 2 via RESPIRATORY_TRACT
  Filled 2023-05-02: qty 6.7

## 2023-05-02 NOTE — ED Triage Notes (Signed)
 Pt states increased shortness of breath starting this morning. Pt was seen at [facility] and diagnosed Flu on Wednesday. Pt was prescribed Tamilfu, promethazine-DM, and ibuprofen. Pt denies hx of lung issues.

## 2023-05-02 NOTE — ED Provider Notes (Signed)
  [place] EMERGENCY DEPARTMENT AT [facility] Provider Note   CSN: [id number] Arrival date & time: 05/02/23  1159     History  Chief Complaint  Patient presents with   Influenza    Heidi Owens is a 38 y.o. female.   Influenza Patient shortness of breath and myalgias.  Diagnosed with influenza around 4 days ago.  States she still feeling bad.  May have some increasing shortness of breath.  No history of COPD.  Does smoke.     Home Medications Prior to Admission medications   Medication Sig Start Date End Date Taking? Authorizing Provider  clindamycin (CLEOCIN) 300 MG capsule Take 1 capsule (300 mg total) by mouth 4 (four) times daily. 07/19/21   Triplett, Tammy, PA-C  ibuprofen (ADVIL) 800 MG tablet Take 1 tablet (800 mg total) by mouth 3 (three) times daily. Take with food 07/19/21   Pauline Aus, PA-C      Allergies    Other    Review of Systems   Review of Systems  Physical Exam Updated Vital Signs BP (!) 131/99 (BP Location: Left Arm)   Pulse 92   Temp 98.2 F (36.8 C) (Oral)   Resp 18   Ht 5\' 5"  (1.651 m)   Wt 80.7 kg   LMP 04/11/2023 (Exact Date)   SpO2 100%   BMI 29.62 kg/m  Physical Exam Vitals and nursing note reviewed.  Cardiovascular:     Rate and Rhythm: Normal rate.  Pulmonary:     Breath sounds: No wheezing or rhonchi.  Abdominal:     Tenderness: There is no abdominal tenderness.  Musculoskeletal:     Right lower leg: No edema.     Left lower leg: No edema.  Neurological:     Mental Status: She is alert.     ED Results / Procedures / Treatments   Labs (all labs ordered are listed, but only abnormal results are displayed) Labs Reviewed - No data to display  EKG None  Radiology No results found.  Procedures Procedures    Medications Ordered in ED Medications  albuterol (VENTOLIN HFA) 108 (90 Base) MCG/ACT inhaler 2 puff (has no administration in time range)    ED Course/ Medical Decision Making/ A&P                                  Medical Decision Making Amount and/or Complexity of Data Reviewed Radiology: ordered.  Risk Prescription drug management.   Patient with known influenza.  Feeling more short of breath.  Will get chest x-ray.  Will ambulate.  Differential diagnosis does include just influenza but also causes such as superimposed pneumonia.  X-ray negative.  No hypoxia.  Will discharge home with inhaler.        Final Clinical Impression(s) / ED Diagnoses Final diagnoses:  None    Rx / DC Orders ED Discharge Orders     None         Benjiman Core, MD 05/02/23 1505

## 2023-05-02 NOTE — ED Notes (Signed)
 Pt ambulated in hallway for approximately 100 ft and oxygen saturation remained at 100%.
